# Patient Record
Sex: Female | Born: 1940 | Race: White | Hispanic: No | Marital: Married | Smoking: Former smoker
Health system: Southern US, Community
[De-identification: ages and names within clinical notes are randomized; demographics above are authoritative.]

## PROBLEM LIST (undated history)

## (undated) DIAGNOSIS — E039 Hypothyroidism, unspecified: Secondary | ICD-10-CM

## (undated) DIAGNOSIS — I10 Essential (primary) hypertension: Secondary | ICD-10-CM

## (undated) DIAGNOSIS — N289 Disorder of kidney and ureter, unspecified: Secondary | ICD-10-CM

## (undated) DIAGNOSIS — N739 Female pelvic inflammatory disease, unspecified: Secondary | ICD-10-CM

## (undated) DIAGNOSIS — I219 Acute myocardial infarction, unspecified: Secondary | ICD-10-CM

## (undated) DIAGNOSIS — M1990 Unspecified osteoarthritis, unspecified site: Secondary | ICD-10-CM

## (undated) DIAGNOSIS — E785 Hyperlipidemia, unspecified: Secondary | ICD-10-CM

## (undated) DIAGNOSIS — I2510 Atherosclerotic heart disease of native coronary artery without angina pectoris: Secondary | ICD-10-CM

## (undated) DIAGNOSIS — I5040 Unspecified combined systolic (congestive) and diastolic (congestive) heart failure: Secondary | ICD-10-CM

## (undated) DIAGNOSIS — D649 Anemia, unspecified: Secondary | ICD-10-CM

## (undated) DIAGNOSIS — R911 Solitary pulmonary nodule: Secondary | ICD-10-CM

## (undated) DIAGNOSIS — J449 Chronic obstructive pulmonary disease, unspecified: Secondary | ICD-10-CM

## (undated) DIAGNOSIS — E119 Type 2 diabetes mellitus without complications: Secondary | ICD-10-CM

## (undated) HISTORY — PX: MANDIBLE FRACTURE SURGERY: SHX706

## (undated) HISTORY — DX: Hyperlipidemia, unspecified: E78.5

## (undated) HISTORY — DX: Unspecified combined systolic (congestive) and diastolic (congestive) heart failure: I50.40

## (undated) HISTORY — DX: Hypothyroidism, unspecified: E03.9

## (undated) HISTORY — DX: Female pelvic inflammatory disease, unspecified: N73.9

## (undated) HISTORY — DX: Unspecified osteoarthritis, unspecified site: M19.90

## (undated) HISTORY — DX: Anemia, unspecified: D64.9

## (undated) HISTORY — DX: Acute myocardial infarction, unspecified: I21.9

## (undated) HISTORY — DX: Type 2 diabetes mellitus without complications: E11.9

## (undated) HISTORY — PX: BACK SURGERY: SHX140

## (undated) HISTORY — DX: Chronic obstructive pulmonary disease, unspecified: J44.9

## (undated) HISTORY — PX: TUBAL LIGATION: SHX77

## (undated) HISTORY — DX: Essential (primary) hypertension: I10

## (undated) HISTORY — DX: Disorder of kidney and ureter, unspecified: N28.9

## (undated) HISTORY — DX: Atherosclerotic heart disease of native coronary artery without angina pectoris: I25.10

## (undated) HISTORY — DX: Solitary pulmonary nodule: R91.1

---

## 1997-12-22 ENCOUNTER — Other Ambulatory Visit: Admission: RE | Admit: 1997-12-22 | Discharge: 1997-12-22 | Payer: Self-pay | Admitting: Family Medicine

## 1999-06-05 ENCOUNTER — Other Ambulatory Visit: Admission: RE | Admit: 1999-06-05 | Discharge: 1999-06-05 | Payer: Self-pay | Admitting: Family Medicine

## 2000-07-03 ENCOUNTER — Other Ambulatory Visit: Admission: RE | Admit: 2000-07-03 | Discharge: 2000-07-03 | Payer: Self-pay | Admitting: Family Medicine

## 2001-12-08 ENCOUNTER — Other Ambulatory Visit: Admission: RE | Admit: 2001-12-08 | Discharge: 2001-12-08 | Payer: Self-pay | Admitting: Family Medicine

## 2002-03-10 ENCOUNTER — Encounter: Payer: Self-pay | Admitting: Nephrology

## 2002-03-10 ENCOUNTER — Ambulatory Visit (HOSPITAL_COMMUNITY): Admission: RE | Admit: 2002-03-10 | Discharge: 2002-03-10 | Payer: Self-pay | Admitting: Nephrology

## 2003-01-13 ENCOUNTER — Other Ambulatory Visit: Admission: RE | Admit: 2003-01-13 | Discharge: 2003-01-13 | Payer: Self-pay | Admitting: Family Medicine

## 2004-05-14 ENCOUNTER — Other Ambulatory Visit: Admission: RE | Admit: 2004-05-14 | Discharge: 2004-05-14 | Payer: Self-pay | Admitting: Family Medicine

## 2005-07-15 ENCOUNTER — Encounter: Payer: Self-pay | Admitting: Internal Medicine

## 2005-08-16 ENCOUNTER — Other Ambulatory Visit: Admission: RE | Admit: 2005-08-16 | Discharge: 2005-08-16 | Payer: Self-pay | Admitting: Family Medicine

## 2007-12-11 ENCOUNTER — Encounter: Admission: RE | Admit: 2007-12-11 | Discharge: 2007-12-11 | Payer: Self-pay | Admitting: Family Medicine

## 2007-12-18 ENCOUNTER — Emergency Department (HOSPITAL_COMMUNITY): Admission: EM | Admit: 2007-12-18 | Discharge: 2007-12-19 | Payer: Self-pay | Admitting: Emergency Medicine

## 2007-12-24 ENCOUNTER — Ambulatory Visit: Payer: Self-pay | Admitting: Internal Medicine

## 2007-12-24 DIAGNOSIS — J441 Chronic obstructive pulmonary disease with (acute) exacerbation: Secondary | ICD-10-CM | POA: Insufficient documentation

## 2007-12-30 DIAGNOSIS — R918 Other nonspecific abnormal finding of lung field: Secondary | ICD-10-CM

## 2008-03-14 ENCOUNTER — Encounter: Admission: RE | Admit: 2008-03-14 | Discharge: 2008-03-14 | Payer: Self-pay | Admitting: Family Medicine

## 2008-08-03 ENCOUNTER — Encounter: Payer: Self-pay | Admitting: Internal Medicine

## 2008-08-03 ENCOUNTER — Encounter: Admission: RE | Admit: 2008-08-03 | Discharge: 2008-08-03 | Payer: Self-pay | Admitting: Family Medicine

## 2008-08-03 ENCOUNTER — Ambulatory Visit: Payer: Self-pay

## 2008-10-12 ENCOUNTER — Encounter: Payer: Self-pay | Admitting: Internal Medicine

## 2008-10-14 ENCOUNTER — Ambulatory Visit: Payer: Self-pay | Admitting: Internal Medicine

## 2008-10-14 DIAGNOSIS — E119 Type 2 diabetes mellitus without complications: Secondary | ICD-10-CM

## 2008-10-14 DIAGNOSIS — N259 Disorder resulting from impaired renal tubular function, unspecified: Secondary | ICD-10-CM | POA: Insufficient documentation

## 2008-11-04 ENCOUNTER — Encounter (INDEPENDENT_AMBULATORY_CARE_PROVIDER_SITE_OTHER): Payer: Self-pay | Admitting: Internal Medicine

## 2008-11-04 ENCOUNTER — Inpatient Hospital Stay (HOSPITAL_COMMUNITY): Admission: EM | Admit: 2008-11-04 | Discharge: 2008-11-22 | Payer: Self-pay | Admitting: Emergency Medicine

## 2008-11-04 ENCOUNTER — Ambulatory Visit: Payer: Self-pay | Admitting: Emergency Medicine

## 2008-11-04 ENCOUNTER — Ambulatory Visit: Payer: Self-pay | Admitting: *Deleted

## 2008-11-05 ENCOUNTER — Encounter (INDEPENDENT_AMBULATORY_CARE_PROVIDER_SITE_OTHER): Payer: Self-pay | Admitting: Internal Medicine

## 2008-11-16 ENCOUNTER — Encounter (INDEPENDENT_AMBULATORY_CARE_PROVIDER_SITE_OTHER): Payer: Self-pay | Admitting: Nephrology

## 2008-11-16 ENCOUNTER — Ambulatory Visit: Payer: Self-pay | Admitting: Vascular Surgery

## 2008-12-14 ENCOUNTER — Encounter: Payer: Self-pay | Admitting: Internal Medicine

## 2008-12-15 DIAGNOSIS — R000 Tachycardia, unspecified: Secondary | ICD-10-CM

## 2008-12-15 DIAGNOSIS — I219 Acute myocardial infarction, unspecified: Secondary | ICD-10-CM | POA: Insufficient documentation

## 2008-12-15 DIAGNOSIS — D649 Anemia, unspecified: Secondary | ICD-10-CM | POA: Insufficient documentation

## 2008-12-15 DIAGNOSIS — I5040 Unspecified combined systolic (congestive) and diastolic (congestive) heart failure: Secondary | ICD-10-CM | POA: Insufficient documentation

## 2008-12-15 DIAGNOSIS — E785 Hyperlipidemia, unspecified: Secondary | ICD-10-CM | POA: Insufficient documentation

## 2008-12-15 DIAGNOSIS — I10 Essential (primary) hypertension: Secondary | ICD-10-CM | POA: Insufficient documentation

## 2008-12-15 DIAGNOSIS — E039 Hypothyroidism, unspecified: Secondary | ICD-10-CM | POA: Insufficient documentation

## 2008-12-16 ENCOUNTER — Ambulatory Visit: Payer: Self-pay | Admitting: Internal Medicine

## 2008-12-16 ENCOUNTER — Encounter: Payer: Self-pay | Admitting: Cardiology

## 2008-12-16 DIAGNOSIS — I5031 Acute diastolic (congestive) heart failure: Secondary | ICD-10-CM

## 2008-12-22 ENCOUNTER — Telehealth: Payer: Self-pay | Admitting: Cardiology

## 2009-01-17 ENCOUNTER — Ambulatory Visit: Payer: Self-pay | Admitting: Cardiology

## 2009-01-24 ENCOUNTER — Telehealth (INDEPENDENT_AMBULATORY_CARE_PROVIDER_SITE_OTHER): Payer: Self-pay | Admitting: *Deleted

## 2009-01-25 ENCOUNTER — Ambulatory Visit: Payer: Self-pay

## 2009-01-25 ENCOUNTER — Encounter: Payer: Self-pay | Admitting: Cardiology

## 2009-01-31 ENCOUNTER — Emergency Department (HOSPITAL_COMMUNITY): Admission: EM | Admit: 2009-01-31 | Discharge: 2009-02-01 | Payer: Self-pay | Admitting: Emergency Medicine

## 2009-02-21 ENCOUNTER — Telehealth: Payer: Self-pay | Admitting: Cardiology

## 2009-02-27 ENCOUNTER — Ambulatory Visit: Payer: Self-pay | Admitting: Cardiology

## 2009-02-27 ENCOUNTER — Encounter (INDEPENDENT_AMBULATORY_CARE_PROVIDER_SITE_OTHER): Payer: Self-pay | Admitting: *Deleted

## 2009-02-27 DIAGNOSIS — R0602 Shortness of breath: Secondary | ICD-10-CM | POA: Insufficient documentation

## 2009-02-27 LAB — CONVERTED CEMR LAB
ALT: 23 units/L (ref 0–35)
AST: 31 units/L (ref 0–37)
Albumin: 3.7 g/dL (ref 3.5–5.2)
Alkaline Phosphatase: 65 units/L (ref 39–117)
Total CHOL/HDL Ratio: 3
Total Protein: 7.5 g/dL (ref 6.0–8.3)

## 2009-03-18 ENCOUNTER — Emergency Department (HOSPITAL_COMMUNITY): Admission: EM | Admit: 2009-03-18 | Discharge: 2009-03-19 | Payer: Self-pay | Admitting: Emergency Medicine

## 2009-04-06 ENCOUNTER — Ambulatory Visit: Payer: Self-pay | Admitting: Internal Medicine

## 2009-04-06 ENCOUNTER — Encounter: Admission: RE | Admit: 2009-04-06 | Discharge: 2009-04-06 | Payer: Self-pay | Admitting: Family Medicine

## 2009-04-06 ENCOUNTER — Encounter: Admission: RE | Admit: 2009-04-06 | Discharge: 2009-04-06 | Payer: Self-pay | Admitting: Internal Medicine

## 2009-04-18 ENCOUNTER — Encounter: Admission: RE | Admit: 2009-04-18 | Discharge: 2009-04-18 | Payer: Self-pay | Admitting: Family Medicine

## 2009-04-25 ENCOUNTER — Emergency Department (HOSPITAL_COMMUNITY): Admission: EM | Admit: 2009-04-25 | Discharge: 2009-04-25 | Payer: Self-pay | Admitting: Emergency Medicine

## 2009-04-25 ENCOUNTER — Telehealth: Payer: Self-pay | Admitting: Cardiology

## 2009-06-22 ENCOUNTER — Ambulatory Visit: Payer: Self-pay | Admitting: Cardiovascular Disease

## 2009-06-22 ENCOUNTER — Inpatient Hospital Stay (HOSPITAL_COMMUNITY): Admission: EM | Admit: 2009-06-22 | Discharge: 2009-06-30 | Payer: Self-pay | Admitting: Emergency Medicine

## 2009-06-23 ENCOUNTER — Ambulatory Visit: Payer: Self-pay | Admitting: Vascular Surgery

## 2009-06-23 ENCOUNTER — Encounter (INDEPENDENT_AMBULATORY_CARE_PROVIDER_SITE_OTHER): Payer: Self-pay | Admitting: Internal Medicine

## 2009-07-22 ENCOUNTER — Observation Stay (HOSPITAL_COMMUNITY): Admission: EM | Admit: 2009-07-22 | Discharge: 2009-07-24 | Payer: Self-pay | Admitting: Emergency Medicine

## 2009-07-24 ENCOUNTER — Telehealth (INDEPENDENT_AMBULATORY_CARE_PROVIDER_SITE_OTHER): Payer: Self-pay | Admitting: *Deleted

## 2009-09-25 ENCOUNTER — Encounter (INDEPENDENT_AMBULATORY_CARE_PROVIDER_SITE_OTHER): Payer: Self-pay | Admitting: *Deleted

## 2009-10-16 ENCOUNTER — Ambulatory Visit: Payer: Self-pay | Admitting: Internal Medicine

## 2009-10-23 ENCOUNTER — Encounter: Admission: RE | Admit: 2009-10-23 | Discharge: 2009-10-23 | Payer: Self-pay | Admitting: Family Medicine

## 2009-12-11 ENCOUNTER — Ambulatory Visit: Payer: Self-pay | Admitting: Cardiology

## 2009-12-12 ENCOUNTER — Encounter: Admission: RE | Admit: 2009-12-12 | Discharge: 2010-03-12 | Payer: Self-pay | Admitting: Family Medicine

## 2010-02-13 ENCOUNTER — Encounter: Payer: Self-pay | Admitting: Cardiology

## 2010-03-13 ENCOUNTER — Encounter
Admission: RE | Admit: 2010-03-13 | Discharge: 2010-06-11 | Payer: Self-pay | Source: Home / Self Care | Attending: Family Medicine | Admitting: Family Medicine

## 2010-04-16 ENCOUNTER — Encounter: Admission: RE | Admit: 2010-04-16 | Discharge: 2010-04-16 | Payer: Self-pay | Admitting: Internal Medicine

## 2010-04-16 ENCOUNTER — Ambulatory Visit: Payer: Self-pay | Admitting: Internal Medicine

## 2010-07-08 ENCOUNTER — Encounter: Payer: Self-pay | Admitting: Family Medicine

## 2010-07-17 NOTE — Progress Notes (Signed)
Summary: oxygen  Phone Note Call from Patient Call back at [phone]   Caller: Daughter cindy Call For: young Summary of Call: would like to talk to nurse about pt going home on oxygen Initial call taken by: Rickard Patience,  July 24, 2009 2:47 PM  Follow-up for Phone Call        pt is already on o2 pt is going to see cy in march for f/u told daughter to discuss with cy at that time because they want pt to remain on o2 so hospital has agreed to leave o2 order as is until pt see's cy back in march--per daughter Follow-up by: Philipp Deputy CMA,  July 24, 2009 4:10 PM

## 2010-07-17 NOTE — Assessment & Plan Note (Signed)
Summary: rov//td   Copy to:  Scott Long Primary Provider/Referring Provider:  [facility]  CC:  ROV and No new complaints.  History of Present Illness: 10/14/08- COPD, Lung nosules/ MAIC?  [place] with [name] [facility] 08/03/08- Scattered thickening and nodularity, stable from 6/09. Radiologist suggested [facility]. Comfortable at rest on oxygen but very easily DOE. Chronic tingling paresthesias left hand and arm, little headache. Being RX'd for cellulitis for legs. Rarely coughs any phlegm. Denies fever, blood. Feels some swelling left neck.Stays on oxygen 2.5 L/M [place]  April 06, 2009- Dyspnea, Diastolic CHF, Lung nodule, COPD Hosp in May with MI, CHF, renal insufficiency, respiratory failure and pneumonia Discharged from [facility] psychiatric 1 week ago.  f/u-in hosp.1 wk. ago,sob slightly worse w/exertion, no cough or wheeze. Says hospital was cleaner air than the mold and dust of her home. No smokers. Continues VentolinHFA 2-3 x daily. Neb machine not currently needing. Has home O2 2 l, portable is on 1 L. Denies cough or chest pain now.  Oct 16, 2009- Dyspnea, Diastolic CHF, Lung nodule, COPD..........................Marland Kitchenhere with husband Hosp in February with acute bronchits and co-morbidities.  Here in wheelchair. She says she can walk a little more than 25 feet in home. Continuous O2 at 2.5 L via [employer]. Denies routine cough, chest pain or ankle edema- sits with feet elevated. Rare need for nebulizer machine. CXR had not shown Lung nodule and VQ scan was low probability. Getting injections for anemia.    Current Medications (verified): 1)  Levothyroxine Sodium 75 Mcg Tabs (Levothyroxine Sodium) .Marland Kitchen.. 1 Tab By Mouth Once Daily 2)  Amlodipine Besylate 5 Mg Tabs (Amlodipine Besylate) .Marland Kitchen.. 1 Tab By Mouth Once Daily 3)  Pantoprazole Sodium 40 Mg Tbec (Pantoprazole Sodium) .... On Tab Once Daily  Pt Using Husband's Medication. 4)  Ecotrin 325 Mg  Tbec (Aspirin) .... Take 1 By Mouth  Once Daily 5)  Zetia 10 Mg  Tabs (Ezetimibe) .... Take 1 By Mouth Once Daily 6)  Lovaza 1 Gm  Caps (Omega-3-Acid Ethyl Esters) .... Take 1 Capsule By Mouth Once Daily 7)  Pulmicort 0.5 Mg/44ml  Susp (Budesonide) .Marland Kitchen.. 1 Two Times A Day Neb 8)  Ipratropium-Albuterol 0.5-2.5 (3) Mg/52ml  Soln (Ipratropium-Albuterol) .... Two Times A Day 9)  Ventolin Hfa 108 (90 Base) Mcg/act Aers (Albuterol Sulfate) .... As Needed 10)  Isosorbide Mononitrate Cr 30 Mg Xr24h-Tab (Isosorbide Mononitrate) .... Take One Tablet By Mouth Daily 11)  Furosemide 20 Mg Tabs (Furosemide) .... As Needed 12)  Hydralazine Hcl 25 Mg Tabs (Hydralazine Hcl) .... Take One Tablet By Mouth Three Times A Day 13)  Plavix 75 Mg Tabs (Clopidogrel Bisulfate) .... Take One Tablet By Mouth Daily 14)  Simvastatin 40 Mg Tabs (Simvastatin) .... Take One Tablet By Mouth Daily At Bedtime 15)  Diltiazem Hcl Er Beads 120 Mg Xr24h-Cap (Diltiazem Hcl Er Beads) .... Take One Capsule By Mouth Daily 16)  Januvia 25 Mg Tabs (Sitagliptin Phosphate) .Marland Kitchen.. 1 Tab By Mouth Once Daily 17)  Fiber Laxative 625 Mg Tabs (Calcium Polycarbophil) .... Once Daily 18)  Stool Softener Laxative 8.6-50 Mg Tabs (Sennosides-Docusate Sodium) .... Once Daily 19)  Risperidone 3 Mg Tabs (Risperidone) .... Take 1 Tablet By Mouth Once A Day 20)  Depakote 250 Mg Tbec (Divalproex Sodium) .... Take 1 Tablet 4 Times A Day By Mouth  Allergies (verified): 1)  ! Flagyl 2)  ! Allopurinol 3)  ! * Latex 4)  ! Septra 5)  ! Cipro (Ciprofloxacin) 6)  !  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   History of tubal ligation.  History of jaw surgery.      Family History: Last updated: 12/15/2008 emphysema, CHF- sisters lung ca- father died glioblastoma- mother   Noncontributory for myocardial infarction or strokes.   Social History: Last updated: 04/06/2009  She lives in [place] with her husband.   She quit  smoking 8 years ago.  No alcohol.      Risk Factors: Smoking Status: quit (12/24/2007)  Review of Systems      See HPI       The patient complains of dyspnea on exertion and peripheral edema.  The patient denies anorexia, fever, weight loss, weight gain, vision loss, decreased hearing, hoarseness, chest pain, syncope, prolonged cough, headaches, hemoptysis, abdominal pain, and severe indigestion/heartburn.    Vital Signs:  Patient profile:   70 year old female Height:      62 inches Weight:      175.4 pounds O2 Sat:      99 % on 2.5 L/min Pulse rate:   72 / minute BP sitting:   126 / 84  (left arm) Cuff size:   regular  Vitals Entered By: Reynaldo Minium CMA (Oct 16, 2009 1:39 PM)  O2 Sat at Rest %:  99 O2 Flow:  2.5 L/min  Physical Exam  Additional Exam:  General: A/Ox3; pleasant and cooperative, NAD, obese, calm, supplemental oxygen with demand regulator, sat 99% on 2.5L SKIN: no rash, lesions NODES: no lymphadenopathy HEENT: [place]/AT, EOM- WNL, Conjuctivae- clear, PERRLA, TM-WNL, Nose- clear, Throat- clear and wnl NECK: Supple w/ fair ROM, JVD- none, normal carotid impulses w/o bruits Thyroid-  CHEST: Wheeze in upper zones without rales or increased effort HEART: RRR, no m/g/r heard ABDOMEN:  ZOX:WRUE, nl pulses, 1 + edema NEURO: Grossly intact to observation      Impression & Recommendations:  Problem # 1:  COPD (ICD-496) Stable control after winter viral pattern acute exacerbation, She can take her oxygen  off some in daytime if at rest. She is comfortable and is not feeling need to increase meds just to clear mild residual wheeze. We could add Spiriva, but I don't think she would see cost-effective improvement.  Problem # 2:  LUNG NODULE (ICD-518.89)  No lung nodule identified on most recent CXR.  Medications Added to Medication List This Visit: 1)  Miralax Powd (Polyethylene glycol 3350) .... Once daily 2)  Vitamin D 1000 Unit Tabs (Cholecalciferol) .... Three times a day 3)  One Daily Tabs (Multiple vitamin) .... Qd 4)  Calcarb 600 1500 Mg Tabs (Calcium carbonate) .... Qd 5)  Oxygen 2.5 L Continuous  .... [facility]  Other Orders: Est. Patient Level III [phone])  Patient Instructions: 1)  Please schedule a follow-up appointment in 6 months. 2)  continue present treatment- call sooner if needed

## 2010-07-17 NOTE — Assessment & Plan Note (Signed)
Summary: f89m   Referring Provider:  Lindaann Pascal Primary Provider:  [facility]  CC:  sob and pt did not bring med list called out orally.  History of Present Illness: Pleasant female with a history of COPD, diastolic congestive heart failure and renal insufficiency for f/u. She had a non-ST elevation myocardial infarction previously in the setting of sepsis. An echocardiogram was performed on Nov 05, 2008 and it showed an ejection fraction of 40-45% with wall motion abnormalities. Given renal insufficiency it was felt that medical therapy was indicated. I last saw her on August 3 of this year. We scheduled a Myoview which was performed on January 25, 2009. This showed no ischemia or infarction and her ejection fraction was 73%. Since then she has dyspnea with activities. There is no orthopnea, PND or pedal edema. Her dyspnea is relieved with rest. There is no associated chest pain. She does not have exertional chest pain, palpitations or syncope.  Current Medications (verified): 1)  Levothyroxine Sodium 75 Mcg Tabs (Levothyroxine Sodium) .Marland Kitchen.. 1 Tab By Mouth Once Daily 2)  Amlodipine Besylate 5 Mg Tabs (Amlodipine Besylate) .Marland Kitchen.. 1 Tab By Mouth Once Daily 3)  Pantoprazole Sodium 40 Mg Tbec (Pantoprazole Sodium) .... On Tab Once Daily  Pt Using Husband's Medication. 4)  Ecotrin 325 Mg  Tbec (Aspirin) .... Take 1 By Mouth Once Daily 5)  Zetia 10 Mg  Tabs (Ezetimibe) .... Take 1 By Mouth Once Daily 6)  Lovaza 1 Gm  Caps (Omega-3-Acid Ethyl Esters) .... Take 1 Capsule By Mouth Once Daily 7)  Pulmicort 0.5 Mg/24ml  Susp (Budesonide) .Marland Kitchen.. 1 Two Times A Day Neb 8)  Ipratropium-Albuterol 0.5-2.5 (3) Mg/22ml  Soln (Ipratropium-Albuterol) .... Two Times A Day 9)  Ventolin Hfa 108 (90 Base) Mcg/act Aers (Albuterol Sulfate) .... As Needed 10)  Isosorbide Mononitrate Cr 30 Mg Xr24h-Tab (Isosorbide Mononitrate) .... Take One Tablet By Mouth Daily 11)  Hydralazine Hcl 25 Mg Tabs (Hydralazine Hcl) .... Take One Tablet By  Mouth Three Times A Day 12)  Plavix 75 Mg Tabs (Clopidogrel Bisulfate) .... Take One Tablet By Mouth Daily 13)  Simvastatin 40 Mg Tabs (Simvastatin) .... Take One Tablet By Mouth Daily At Bedtime 14)  Diltiazem Hcl Er Beads 120 Mg Xr24h-Cap (Diltiazem Hcl Er Beads) .... Take One Capsule By Mouth Daily 15)  Miralax  Powd (Polyethylene Glycol 3350) .... Once Daily 16)  Stool Softener Laxative 8.6-50 Mg Tabs (Sennosides-Docusate Sodium) .... Once Daily 17)  Risperidone 3 Mg Tabs (Risperidone) .... 1/2 Tab By Mouth Once Daily 18)  Depakote 250 Mg Tbec (Divalproex Sodium) .... Take 1 Tablet 4 Times A Day By Mouth 19)  Vitamin D 1000 Unit Tabs (Cholecalciferol) .... Three Times A Day 20)  One Daily  Tabs (Multiple Vitamin) .... Qd 21)  Calcarb 600 1500 Mg Tabs (Calcium Carbonate) .... Qd 22)  Oxygen 2.5 L Continuous .... [facility]  Allergies: 1)  ! Flagyl 2)  ! Allopurinol 3)  ! * Latex 4)  ! Septra 5)  ! Cipro (Ciprofloxacin) 6)  ! 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  History of back surgery.   History of tubal ligation.  History of jaw surgery.      Social History: Reviewed  history from 04/06/2009 and no changes required.  She lives in [place] with her husband.  Former tobacco abuse.  No alcohol.      Review of Systems       Problems with weakness and gait instability as well as arthritis but no fevers or chills, productive cough, hemoptysis, dysphasia, odynophagia, melena, hematochezia,  dysuria, hematuria, rash, seizure activity, orthopnea, PND, pedal edema, claudication. Remaining systems are negative.   Vital Signs:  Patient profile:   70 year old female Height:      62 inches Weight:      173 pounds BMI:     31.76 Pulse rate:   80 / minute Resp:     12 per minute BP sitting:   138 / 69  (left arm)  Vitals Entered By: Kem Parkinson (December 11, 2009 2:01 PM)   Physical Exam  General:  Well-developed well-nourished in no acute distress.  Skin is warm and dry.  HEENT is normal.  Neck is supple. No thyromegaly.  Chest is clear to auscultation with normal expansion.  Cardiovascular exam is regular rate and rhythm.  Abdominal exam nontender or distended. No masses palpated. Extremities show no edema. neuro grossly intact    EKG  Procedure date:  12/11/2009  Findings:      Normal sinus rhythm at a rate of 78. Left anterior fascicular block. RV conduction delay.  Impression & Recommendations:  Problem # 1:  DYSPNEA (ICD-786.05) Most likely secondary to lung disease. Her updated medication list for this problem includes:    Amlodipine Besylate 5 Mg Tabs (Amlodipine besylate) .Marland Kitchen... 1 tab by mouth once daily    Ecotrin 325 Mg Tbec (Aspirin) .Marland Kitchen... Take 1 by mouth once daily    Diltiazem Hcl Er Beads 120 Mg Xr24h-cap (Diltiazem hcl er beads) .Marland Kitchen... Take one capsule by mouth daily  Problem # 2:  HYPERTENSION (ICD-401.9) Continue present medications. Her updated medication list for this problem includes:    Amlodipine Besylate 5 Mg Tabs (Amlodipine besylate) .Marland Kitchen... 1 tab by mouth once daily    Ecotrin 325 Mg Tbec (Aspirin) .Marland Kitchen... Take 1 by mouth once daily    Hydralazine Hcl 25 Mg Tabs (Hydralazine hcl) .Marland Kitchen... Take one tablet by mouth three times a day    Diltiazem Hcl Er Beads 120 Mg Xr24h-cap (Diltiazem hcl er beads) .Marland Kitchen... Take one capsule by mouth daily  Problem # 3:  MYOCARDIAL INFARCTION (ICD-410.90)  The following medications were removed from the  medication list:    Plavix 75 Mg Tabs (Clopidogrel bisulfate) .Marland Kitchen... Take one tablet by mouth daily Her updated medication list for this problem includes:    Amlodipine Besylate 5 Mg Tabs (Amlodipine besylate) .Marland Kitchen... 1 tab by mouth once daily    Ecotrin 325 Mg Tbec (Aspirin) .Marland Kitchen... Take 1 by mouth once daily    Isosorbide Mononitrate Cr 30 Mg Xr24h-tab (Isosorbide mononitrate) .Marland Kitchen... Take one tablet by mouth daily    Diltiazem Hcl Er Beads 120 Mg Xr24h-cap (Diltiazem hcl er beads) .Marland Kitchen... Take one capsule by mouth daily  Problem # 4:  HYPERLIPIDEMIA (ICD-272.4) Discontinue Zocor given recent FDA warning. Begin Pravachol 40 mg p.o. daily. Check lipids and liver in 6 weeks. Her updated medication list for this problem includes:    Zetia 10 Mg Tabs (Ezetimibe) .Marland Kitchen... Take 1 by mouth once daily    Lovaza 1 Gm Caps (Omega-3-acid ethyl esters) .Marland Kitchen... Take 1 capsule by mouth once daily    Pravastatin Sodium 40 Mg Tabs (Pravastatin sodium) .Marland Kitchen... Take one  tablet by mouth daily at bedtime  Problem # 5:  HYPOTHYROIDISM (ICD-244.9)  Her updated medication list for this problem includes:    Levothyroxine Sodium 75 Mcg Tabs (Levothyroxine sodium) .Marland Kitchen... 1 tab by mouth once daily  Her updated medication list for this problem includes:    Levothyroxine Sodium 75 Mcg Tabs (Levothyroxine sodium) .Marland Kitchen... 1 tab by mouth once daily  Problem # 6:  COPD (ICD-496)  Management per pulmonary. Her updated medication list for this problem includes:    Pulmicort 0.5 Mg/69ml Susp (Budesonide) .Marland Kitchen... 1 two times a day neb    Ipratropium-albuterol 0.5-2.5 (3) Mg/16ml Soln (Ipratropium-albuterol) .Marland Kitchen..Marland Kitchen Two times a day    Ventolin Hfa 108 (90 Base) Mcg/act Aers (Albuterol sulfate) .Marland Kitchen... As needed  Her updated medication list for this problem includes:    Pulmicort 0.5 Mg/33ml Susp (Budesonide) .Marland Kitchen... 1 two times a day neb    Ipratropium-albuterol 0.5-2.5 (3) Mg/64ml Soln (Ipratropium-albuterol) .Marland Kitchen..Marland Kitchen Two times a day    Ventolin Hfa  108 (90 Base) Mcg/act Aers (Albuterol sulfate) .Marland Kitchen... As needed  Problem # 7:  RENAL INSUFFICIENCY (ICD-588.9) Management per nephrology.  Problem # 8:  AODM (ICD-250.00)  Her updated medication list for this problem includes:    Ecotrin 325 Mg Tbec (Aspirin) .Marland Kitchen... Take 1 by mouth once daily  Her updated medication list for this problem includes:    Ecotrin 325 Mg Tbec (Aspirin) .Marland Kitchen... Take 1 by mouth once daily  Patient Instructions: 1)  Your physician recommends that you schedule a follow-up appointment in:ONE YEAR 2)  Your physician recommends that you return for lab work in:6 WEEKS-2ND WEEK IN [place] 3)  Your physician has recommended you make the following change in your medication: STOP PLAVIX 4)  START PRAVASTATIN 50MG  ONE TABLET ONCE DAILY Prescriptions: PRAVASTATIN SODIUM 40 MG TABS (PRAVASTATIN SODIUM) Take one tablet by mouth daily at bedtime  #30 x 12   Entered by:   Deliah Goody, RN   Authorized by:   Ferman Hamming, MD, [facility]   Signed by:   Deliah Goody, RN on 12/11/2009   Method used:   Electronically to        [facility] [phone]* (retail)       [address] [place], [place]  [address]       Ph: [phone] or [phone]       Fax: [phone]   RxID:   [id number]

## 2010-07-17 NOTE — Letter (Signed)
Summary: Appointment - Missed  [facility]     [place], [place]    Phone:   Fax:      September 25, 2009 MRN: [id number]   [facility] [address] [place], [place]  [address]   Dear Ms. Plato,  Our records indicate you missed your appointment on  09-12-2009   with  Dr. Jens Som  It is very important that we reach you to reschedule this appointment. We look forward to participating in your health care needs. Please contact us at the number listed above at your earliest convenience to reschedule this appointment.     Sincerely,      Lorne Skeens  [facility] Scheduling Team

## 2010-07-17 NOTE — Assessment & Plan Note (Signed)
Summary: rov 6 months///kp   Copy to:  Scott Long Primary Murel Shenberger/Referring [place] Mcphee:  [facility]  CC:  6 month follow up visit-COPD; Increased SOB with activity..  History of Present Illness:  April 06, 2009- Dyspnea, Diastolic CHF, Lung nodule, COPD Hosp in May with MI, CHF, renal insufficiency, respiratory failure and pneumonia Discharged from [facility] psychiatric 1 week ago.  f/u-in hosp.1 wk. ago,sob slightly worse w/exertion, no cough or wheeze. Says hospital was cleaner air than the mold and dust of her home. No smokers. Continues VentolinHFA 2-3 x daily. Neb machine not currently needing. Has home O2 2 l, portable is on 1 L. Denies cough or chest pain now.  Oct 16, 2009- Dyspnea, Diastolic CHF, Lung nodule, COPD..........................Marland Kitchenhere with husband Hosp in February with acute bronchits and co-morbidities.  Here in wheelchair. She says she can walk a little more than 25 feet in home. Continuous O2 at 2.5 L via [employer]. Denies routine cough, chest pain or ankle edema- sits with feet elevated. Rare need for nebulizer machine. CXR had not shown Lung nodule and VQ scan was low probability. Getting injections for anemia.  April 16, 2010- Dyspnea, Diastolic CHF, Lung nodule, COPD..........................Marland Kitchenhere with husband Nurse-CC: 6 month follow up visit-COPD; Increased SOB with activity. Was seen  by cardiology in August and most of her dyspnea was attributed to her lungs. EF by ECHO was 40-45%. No ischemia. She deinies cough, phlegm or chest pain. Denies any significant change since Spring. She has been able to turn home O2 down from 2.5L to 1.5 L/M.  She is not acitve except walking some room to room at home. Sore feet also limit her activity.  She is getting Procrit injections from Dr Hyman Hopes for anemia.    Preventive Screening-Counseling & Management  Alcohol-Tobacco     Smoking Status: quit     Packs/Day: 0.75     Year Quit: 1999  Current  Medications (verified): 1)  Levothroid 88 Mcg Tabs (Levothyroxine Sodium) .... Take 1 By Mouth Once Daily 2)  Pantoprazole Sodium 40 Mg Tbec (Pantoprazole Sodium) .... On Tab Once Daily  Pt Using Husband's Medication. 3)  Ecotrin 325 Mg  Tbec (Aspirin) .... Take 1 By Mouth Once Daily 4)  Zetia 10 Mg  Tabs (Ezetimibe) .... Take 1 By Mouth Once Daily 5)  Lovaza 1 Gm  Caps (Omega-3-Acid Ethyl Esters) .... Take 1 Capsule By Mouth Once Daily 6)  Ventolin Hfa 108 (90 Base) Mcg/act Aers (Albuterol Sulfate) .... As Needed 7)  Isosorbide Mononitrate Cr 30 Mg Xr24h-Tab (Isosorbide Mononitrate) .... Take One Tablet By Mouth Daily 8)  Hydralazine Hcl 25 Mg Tabs (Hydralazine Hcl) .... Take One Tablet By Mouth Three Times A Day 9)  Miralax  Powd (Polyethylene Glycol 3350) .... Once Daily 10)  Stool Softener Laxative 8.6-50 Mg Tabs (Sennosides-Docusate Sodium) .... Once Daily 11)  Depakote 250 Mg Tbec (Divalproex Sodium) .... Take 1 Tablet 4 Times A Day By Mouth 12)  Oxygen 2.5 L Continuous .... [facility]  Allergies (verified): 1)  ! Flagyl 2)  ! Allopurinol 3)  ! * Latex 4)  ! Septra 5)  ! Cipro (Ciprofloxacin) 6)  ! 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  History of tubal ligation.  History of jaw surgery.      Family History: Last updated: 12/15/2008 emphysema, CHF- sisters lung ca- father died glioblastoma- mother   Noncontributory for myocardial infarction or strokes.   Social  History: Last updated: 12/11/2009  She lives in [place] with her husband.  Former tobacco abuse.  No alcohol.      Risk Factors: Smoking Status: quit (04/16/2010) Packs/Day: 0.75 (04/16/2010)  Social History: Packs/Day:  0.75  Review of Systems      See HPI       The patient complains of shortness of breath with activity and irregular heartbeats.  The patient denies shortness of breath at rest, productive cough, non-productive cough, coughing up blood, chest pain, acid heartburn, indigestion, loss of appetite, weight change, abdominal pain, difficulty swallowing, sore throat, tooth/dental problems, headaches, nasal congestion/difficulty breathing through nose, sneezing, ear ache, hand/feet swelling, rash, and fever.    Vital Signs:  Patient profile:   70 year old female Height:      62 inches O2 Sat:      98 % on 1.5 L/min Pulse rate:   81 / minute BP sitting:   140 / 68  (left arm) Cuff size:   regular  Vitals Entered By: Reynaldo Minium CMA (April 16, 2010 1:55 PM)  O2 Flow:  1.5 L/min CC: 6 month follow up visit-COPD; Increased SOB with activity.   Physical Exam  Additional Exam:  General: A/Ox3; pleasant and cooperative, NAD, obese, calm, supplemental oxygen with demand regulator, sat 98% on 1.5L SKIN: no rash, lesions NODES: no lymphadenopathy HEENT: [place]/AT, EOM- WNL, Conjuctivae- clear, PERRLA, TM-WNL, Nose- clear, Throat- clear and wnl, edentulous NECK: Supple w/ fair ROM, JVD- none, normal carotid impulses w/o bruits Thyroid-  CHEST: Wheeze in upper zones without rales or increased effort HEART: RRR, no m/g/r heard ABDOMEN: obese VOZ:DGUY, nl pulses, trace edema NEURO: Grossly intact to observation, wheelchair      Impression & Recommendations:  Problem # 1:  COPD (ICD-496) Clinically stable. Limiting dyspnea reflects her lung disease but likly also some heart disease, anemia and deconditioning.   Problem # 2:  LUNG NODULE (ICD-518.89)  6 mm nodule LUL  seen on prior CT. We will recheck that.   Medications Added to Medication List This Visit: 1)  Levothroid 88 Mcg Tabs (Levothyroxine sodium) .... Take 1 by mouth once daily  Other Orders: Est. Patient Level IV (40347) Radiology Referral (Radiology)  Patient Instructions: 1)  Please schedule a follow-up appointment in 6 months. 2)  A Chest CT WITHOUT Contrast has been recommended. Your imaging study may require preauthorization.

## 2010-07-17 NOTE — Letter (Signed)
Summary: Patient's Med List  Patient's Med List   Imported By: Marylou Mccoy 12/13/2009 08:30:07  _____________________________________________________________________  External Attachment:    Type:   Image     Comment:   External Document

## 2010-09-02 LAB — URINE CULTURE
Colony Count: 10000
Special Requests: POSITIVE

## 2010-09-02 LAB — BASIC METABOLIC PANEL
BUN: 30 mg/dL — ABNORMAL HIGH (ref 6–23)
BUN: 30 mg/dL — ABNORMAL HIGH (ref 6–23)
BUN: 38 mg/dL — ABNORMAL HIGH (ref 6–23)
BUN: 47 mg/dL — ABNORMAL HIGH (ref 6–23)
BUN: 58 mg/dL — ABNORMAL HIGH (ref 6–23)
CO2: 30 mEq/L (ref 19–32)
Calcium: 10 mg/dL (ref 8.4–10.5)
Calcium: 10.5 mg/dL (ref 8.4–10.5)
Calcium: 10.6 mg/dL — ABNORMAL HIGH (ref 8.4–10.5)
Chloride: 102 mEq/L (ref 96–112)
Chloride: 98 mEq/L (ref 96–112)
Chloride: 99 mEq/L (ref 96–112)
Creatinine, Ser: 1.67 mg/dL — ABNORMAL HIGH (ref 0.4–1.2)
Creatinine, Ser: 2.25 mg/dL — ABNORMAL HIGH (ref 0.4–1.2)
GFR calc Af Amer: 26 mL/min — ABNORMAL LOW (ref >60)
GFR calc non Af Amer: 21 mL/min — ABNORMAL LOW (ref >60)
GFR calc non Af Amer: 22 mL/min — ABNORMAL LOW (ref >60)
GFR calc non Af Amer: 30 mL/min — ABNORMAL LOW (ref >60)
Glucose, Bld: 118 mg/dL — ABNORMAL HIGH (ref 70–99)
Glucose, Bld: 138 mg/dL — ABNORMAL HIGH (ref 70–99)
Glucose, Bld: 84 mg/dL (ref 70–99)
Glucose, Bld: 86 mg/dL (ref 70–99)
Potassium: 4.4 mEq/L (ref 3.5–5.1)
Potassium: 4.9 mEq/L (ref 3.5–5.1)
Potassium: 5.4 mEq/L — ABNORMAL HIGH (ref 3.5–5.1)
Sodium: 139 mEq/L (ref 135–145)
Sodium: 139 mEq/L (ref 135–145)
Sodium: 139 mEq/L (ref 135–145)

## 2010-09-02 LAB — GLUCOSE, CAPILLARY
Glucose-Capillary: 108 mg/dL — ABNORMAL HIGH (ref 70–99)
Glucose-Capillary: 111 mg/dL — ABNORMAL HIGH (ref 70–99)
Glucose-Capillary: 112 mg/dL — ABNORMAL HIGH (ref 70–99)
Glucose-Capillary: 112 mg/dL — ABNORMAL HIGH (ref 70–99)
Glucose-Capillary: 114 mg/dL — ABNORMAL HIGH (ref 70–99)
Glucose-Capillary: 118 mg/dL — ABNORMAL HIGH (ref 70–99)
Glucose-Capillary: 119 mg/dL — ABNORMAL HIGH (ref 70–99)
Glucose-Capillary: 120 mg/dL — ABNORMAL HIGH (ref 70–99)
Glucose-Capillary: 122 mg/dL — ABNORMAL HIGH (ref 70–99)
Glucose-Capillary: 123 mg/dL — ABNORMAL HIGH (ref 70–99)
Glucose-Capillary: 124 mg/dL — ABNORMAL HIGH (ref 70–99)
Glucose-Capillary: 128 mg/dL — ABNORMAL HIGH (ref 70–99)
Glucose-Capillary: 129 mg/dL — ABNORMAL HIGH (ref 70–99)
Glucose-Capillary: 129 mg/dL — ABNORMAL HIGH (ref 70–99)
Glucose-Capillary: 138 mg/dL — ABNORMAL HIGH (ref 70–99)
Glucose-Capillary: 145 mg/dL — ABNORMAL HIGH (ref 70–99)
Glucose-Capillary: 149 mg/dL — ABNORMAL HIGH (ref 70–99)
Glucose-Capillary: 151 mg/dL — ABNORMAL HIGH (ref 70–99)
Glucose-Capillary: 161 mg/dL — ABNORMAL HIGH (ref 70–99)
Glucose-Capillary: 179 mg/dL — ABNORMAL HIGH (ref 70–99)
Glucose-Capillary: 87 mg/dL (ref 70–99)
Glucose-Capillary: 89 mg/dL (ref 70–99)
Glucose-Capillary: 90 mg/dL (ref 70–99)
Glucose-Capillary: 90 mg/dL (ref 70–99)
Glucose-Capillary: 91 mg/dL (ref 70–99)
Glucose-Capillary: 94 mg/dL (ref 70–99)
Glucose-Capillary: 99 mg/dL (ref 70–99)

## 2010-09-02 LAB — CBC
HCT: 32.9 % — ABNORMAL LOW (ref 36.0–46.0)
HCT: 34.7 % — ABNORMAL LOW (ref 36.0–46.0)
Hemoglobin: 11 g/dL — ABNORMAL LOW (ref 12.0–15.0)
Hemoglobin: 9.4 g/dL — ABNORMAL LOW (ref 12.0–15.0)
MCV: 92.3 fL (ref 78.0–100.0)
Platelets: 150 10*3/uL (ref 150–400)
Platelets: 154 10*3/uL (ref 150–400)
Platelets: 156 10*3/uL (ref 150–400)
Platelets: 159 10*3/uL (ref 150–400)
RBC: 3.53 MIL/uL — ABNORMAL LOW (ref 3.87–5.11)
RBC: 3.56 MIL/uL — ABNORMAL LOW (ref 3.87–5.11)
RDW: 14.8 % (ref 11.5–15.5)
RDW: 14.9 % (ref 11.5–15.5)
RDW: 14.9 % (ref 11.5–15.5)
WBC: 7.2 10*3/uL (ref 4.0–10.5)
WBC: 8.4 10*3/uL (ref 4.0–10.5)

## 2010-09-02 LAB — URINE MICROSCOPIC-ADD ON

## 2010-09-02 LAB — COMPREHENSIVE METABOLIC PANEL
AST: 17 U/L (ref 0–37)
Albumin: 2.9 g/dL — ABNORMAL LOW (ref 3.5–5.2)
Albumin: 3.4 g/dL — ABNORMAL LOW (ref 3.5–5.2)
Alkaline Phosphatase: 53 U/L (ref 39–117)
BUN: 36 mg/dL — ABNORMAL HIGH (ref 6–23)
CO2: 29 mEq/L (ref 19–32)
Calcium: 9.3 mg/dL (ref 8.4–10.5)
Creatinine, Ser: 1.68 mg/dL — ABNORMAL HIGH (ref 0.4–1.2)
Creatinine, Ser: 1.91 mg/dL — ABNORMAL HIGH (ref 0.4–1.2)
GFR calc Af Amer: 31 mL/min — ABNORMAL LOW (ref >60)
GFR calc Af Amer: 36 mL/min — ABNORMAL LOW (ref >60)
GFR calc non Af Amer: 30 mL/min — ABNORMAL LOW (ref >60)
Potassium: 4.5 mEq/L (ref 3.5–5.1)
Total Protein: 5.7 g/dL — ABNORMAL LOW (ref 6.0–8.3)
Total Protein: 6.7 g/dL (ref 6.0–8.3)

## 2010-09-02 LAB — URINALYSIS, ROUTINE W REFLEX MICROSCOPIC
Glucose, UA: NEGATIVE mg/dL
Ketones, ur: NEGATIVE mg/dL
Leukocytes, UA: NEGATIVE
Nitrite: NEGATIVE
Specific Gravity, Urine: 1.017 (ref 1.005–1.030)
Urobilinogen, UA: 0.2 mg/dL (ref 0.0–1.0)
pH: 6.5 (ref 5.0–8.0)

## 2010-09-02 LAB — POCT I-STAT, CHEM 8
Chloride: 104 mEq/L (ref 96–112)
Creatinine, Ser: 1.9 mg/dL — ABNORMAL HIGH (ref 0.4–1.2)
Glucose, Bld: 133 mg/dL — ABNORMAL HIGH (ref 70–99)
Potassium: 4.5 mEq/L (ref 3.5–5.1)

## 2010-09-02 LAB — URIC ACID: Uric Acid, Serum: 5.3 mg/dL (ref 2.4–7.0)

## 2010-09-02 LAB — DIFFERENTIAL
Eosinophils Relative: 5 % (ref 0–5)
Lymphocytes Relative: 14 % (ref 12–46)
Lymphocytes Relative: 40 % (ref 12–46)
Lymphs Abs: 2.9 10*3/uL (ref 0.7–4.0)
Monocytes Absolute: 0.8 10*3/uL (ref 0.1–1.0)
Monocytes Relative: 10 % (ref 3–12)
Monocytes Relative: 8 % (ref 3–12)
Neutro Abs: 7.6 10*3/uL (ref 1.7–7.7)

## 2010-09-02 LAB — CK TOTAL AND CKMB (NOT AT ARMC)
CK, MB: 0.9 ng/mL (ref 0.3–4.0)
CK, MB: 1 ng/mL (ref 0.3–4.0)
Relative Index: INVALID (ref 0.0–2.5)
Relative Index: INVALID (ref 0.0–2.5)
Relative Index: INVALID (ref 0.0–2.5)
Total CK: 34 U/L (ref 7–177)
Total CK: 46 U/L (ref 7–177)

## 2010-09-02 LAB — HEMOGLOBIN A1C
Hgb A1c MFr Bld: 5.6 % (ref 4.6–6.1)
Mean Plasma Glucose: 114 mg/dL

## 2010-09-02 LAB — PROTIME-INR: Prothrombin Time: 13 seconds (ref 11.6–15.2)

## 2010-09-02 LAB — PHOSPHORUS: Phosphorus: 4.3 mg/dL (ref 2.3–4.6)

## 2010-09-02 LAB — LIPID PANEL
Cholesterol: 133 mg/dL (ref 0–200)
HDL: 39 mg/dL — ABNORMAL LOW (ref >39)
LDL Cholesterol: 48 mg/dL (ref 0–99)
Total CHOL/HDL Ratio: 3.4 RATIO
Triglycerides: 232 mg/dL — ABNORMAL HIGH (ref <150)
VLDL: 46 mg/dL — ABNORMAL HIGH (ref 0–40)

## 2010-09-02 LAB — POCT CARDIAC MARKERS
CKMB, poc: <1 ng/mL — ABNORMAL LOW (ref 1.0–8.0)
Troponin i, poc: <0.05 ng/mL (ref 0.00–0.09)

## 2010-09-02 LAB — TSH: TSH: 0.952 u[IU]/mL (ref 0.350–4.500)

## 2010-09-02 LAB — TROPONIN I
Troponin I: 0.01 ng/mL (ref 0.00–0.06)
Troponin I: 0.02 ng/mL (ref 0.00–0.06)

## 2010-09-05 LAB — BASIC METABOLIC PANEL
BUN: 30 mg/dL — ABNORMAL HIGH (ref 6–23)
CO2: 28 mEq/L (ref 19–32)
Calcium: 8.8 mg/dL (ref 8.4–10.5)
Chloride: 100 mEq/L (ref 96–112)
Creatinine, Ser: 1.97 mg/dL — ABNORMAL HIGH (ref 0.4–1.2)
GFR calc Af Amer: 30 mL/min — ABNORMAL LOW (ref >60)
GFR calc non Af Amer: 25 mL/min — ABNORMAL LOW (ref >60)
Glucose, Bld: 108 mg/dL — ABNORMAL HIGH (ref 70–99)
Potassium: 4.1 mEq/L (ref 3.5–5.1)
Sodium: 137 mEq/L (ref 135–145)

## 2010-09-05 LAB — DIFFERENTIAL
Basophils Absolute: 0 10*3/uL (ref 0.0–0.1)
Basophils Relative: 0 % (ref 0–1)
Eosinophils Relative: 0 % (ref 0–5)
Lymphocytes Relative: 9 % — ABNORMAL LOW (ref 12–46)
Monocytes Absolute: 0.9 10*3/uL (ref 0.1–1.0)

## 2010-09-05 LAB — BLOOD GAS, ARTERIAL
Drawn by: 295031
O2 Content: 2 L/min
O2 Saturation: 96.7 %
Patient temperature: 98.6
pO2, Arterial: 78.2 mmHg — ABNORMAL LOW (ref 80.0–100.0)

## 2010-09-05 LAB — POCT CARDIAC MARKERS
CKMB, poc: <1 ng/mL — ABNORMAL LOW (ref 1.0–8.0)
Myoglobin, poc: 145 ng/mL (ref 12–200)
Troponin i, poc: <0.05 ng/mL (ref 0.00–0.09)

## 2010-09-05 LAB — CBC
HCT: 31.3 % — ABNORMAL LOW (ref 36.0–46.0)
Hemoglobin: 10.6 g/dL — ABNORMAL LOW (ref 12.0–15.0)
MCHC: 34 g/dL (ref 30.0–36.0)
Platelets: 164 10*3/uL (ref 150–400)
RBC: 3.15 MIL/uL — ABNORMAL LOW (ref 3.87–5.11)
RDW: 15.1 % (ref 11.5–15.5)
WBC: 8.6 10*3/uL (ref 4.0–10.5)

## 2010-09-05 LAB — URINALYSIS, ROUTINE W REFLEX MICROSCOPIC
Glucose, UA: NEGATIVE mg/dL
Hgb urine dipstick: NEGATIVE
Protein, ur: >300 mg/dL — AB
pH: 5.5 (ref 5.0–8.0)

## 2010-09-05 LAB — URINE MICROSCOPIC-ADD ON

## 2010-09-19 LAB — COMPREHENSIVE METABOLIC PANEL
ALT: 14 U/L (ref 0–35)
AST: 21 U/L (ref 0–37)
Albumin: 2.8 g/dL — ABNORMAL LOW (ref 3.5–5.2)
CO2: 29 mEq/L (ref 19–32)
Calcium: 9.3 mg/dL (ref 8.4–10.5)
Chloride: 104 mEq/L (ref 96–112)
Creatinine, Ser: 2.07 mg/dL — ABNORMAL HIGH (ref 0.4–1.2)
GFR calc Af Amer: 29 mL/min — ABNORMAL LOW (ref >60)
GFR calc non Af Amer: 24 mL/min — ABNORMAL LOW (ref >60)
Sodium: 140 mEq/L (ref 135–145)

## 2010-09-19 LAB — CBC
HCT: 32.2 % — ABNORMAL LOW (ref 36.0–46.0)
MCV: 88.6 fL (ref 78.0–100.0)
RBC: 3.63 MIL/uL — ABNORMAL LOW (ref 3.87–5.11)
WBC: 10.9 10*3/uL — ABNORMAL HIGH (ref 4.0–10.5)

## 2010-09-19 LAB — CK TOTAL AND CKMB (NOT AT ARMC)
CK, MB: 1.4 ng/mL (ref 0.3–4.0)
Relative Index: INVALID (ref 0.0–2.5)

## 2010-09-19 LAB — DIFFERENTIAL
Eosinophils Absolute: 0.6 10*3/uL (ref 0.0–0.7)
Eosinophils Relative: 5 % (ref 0–5)
Lymphocytes Relative: 12 % (ref 12–46)
Lymphs Abs: 1.3 10*3/uL (ref 0.7–4.0)
Monocytes Absolute: 1.5 10*3/uL — ABNORMAL HIGH (ref 0.1–1.0)

## 2010-09-19 LAB — URINALYSIS, ROUTINE W REFLEX MICROSCOPIC
Bilirubin Urine: NEGATIVE
Glucose, UA: NEGATIVE mg/dL
Hgb urine dipstick: NEGATIVE
Specific Gravity, Urine: 1.016 (ref 1.005–1.030)
pH: 6 (ref 5.0–8.0)

## 2010-09-19 LAB — URINE MICROSCOPIC-ADD ON

## 2010-09-19 LAB — TROPONIN I: Troponin I: 0.01 ng/mL (ref 0.00–0.06)

## 2010-09-19 LAB — URINE CULTURE: Colony Count: 2000

## 2010-09-20 LAB — DIFFERENTIAL
Basophils Absolute: 0.1 10*3/uL (ref 0.0–0.1)
Basophils Relative: 1 % (ref 0–1)
Eosinophils Relative: 3 % (ref 0–5)
Lymphocytes Relative: 25 % (ref 12–46)

## 2010-09-20 LAB — POCT I-STAT, CHEM 8
BUN: 32 mg/dL — ABNORMAL HIGH (ref 6–23)
HCT: 36 % (ref 36.0–46.0)
Hemoglobin: 12.2 g/dL (ref 12.0–15.0)
Sodium: 142 mEq/L (ref 135–145)
TCO2: 25 mmol/L (ref 0–100)

## 2010-09-20 LAB — CBC
HCT: 36 % (ref 36.0–46.0)
MCHC: 32.9 g/dL (ref 30.0–36.0)
Platelets: 250 10*3/uL (ref 150–400)
RDW: 15.5 % (ref 11.5–15.5)

## 2010-09-20 LAB — URINALYSIS, ROUTINE W REFLEX MICROSCOPIC
Glucose, UA: NEGATIVE mg/dL
Hgb urine dipstick: NEGATIVE
Specific Gravity, Urine: 1.02 (ref 1.005–1.030)
Urobilinogen, UA: 0.2 mg/dL (ref 0.0–1.0)

## 2010-09-20 LAB — URINE MICROSCOPIC-ADD ON

## 2010-09-20 LAB — RAPID URINE DRUG SCREEN, HOSP PERFORMED
Barbiturates: NOT DETECTED
Opiates: NOT DETECTED

## 2010-09-20 LAB — ETHANOL: Alcohol, Ethyl (B): <5 mg/dL (ref 0–10)

## 2010-09-22 LAB — BASIC METABOLIC PANEL
BUN: 34 mg/dL — ABNORMAL HIGH (ref 6–23)
CO2: 29 mEq/L (ref 19–32)
Chloride: 98 mEq/L (ref 96–112)
Creatinine, Ser: 2.54 mg/dL — ABNORMAL HIGH (ref 0.4–1.2)
Potassium: 3.6 mEq/L (ref 3.5–5.1)

## 2010-09-22 LAB — BLOOD GAS, ARTERIAL
Bicarbonate: 29 mEq/L — ABNORMAL HIGH (ref 20.0–24.0)
O2 Content: 1.5 L/min
O2 Saturation: 96.1 %
Patient temperature: 98.6
TCO2: 26.1 mmol/L (ref 0–100)
pH, Arterial: 7.436 — ABNORMAL HIGH (ref 7.350–7.400)

## 2010-09-22 LAB — URINALYSIS, ROUTINE W REFLEX MICROSCOPIC
Ketones, ur: NEGATIVE mg/dL
Nitrite: NEGATIVE
Protein, ur: >300 mg/dL — AB
Urobilinogen, UA: 0.2 mg/dL (ref 0.0–1.0)

## 2010-09-22 LAB — DIFFERENTIAL
Basophils Relative: 1 % (ref 0–1)
Eosinophils Absolute: 0.4 10*3/uL (ref 0.0–0.7)
Eosinophils Relative: 4 % (ref 0–5)
Lymphs Abs: 3.4 10*3/uL (ref 0.7–4.0)
Monocytes Relative: 10 % (ref 3–12)
Neutrophils Relative %: 55 % (ref 43–77)

## 2010-09-22 LAB — CBC
HCT: 39.5 % (ref 36.0–46.0)
MCHC: 32.2 g/dL (ref 30.0–36.0)
MCV: 85.8 fL (ref 78.0–100.0)
Platelets: 328 10*3/uL (ref 150–400)

## 2010-09-22 LAB — URINE MICROSCOPIC-ADD ON

## 2010-09-24 LAB — CBC
HCT: 30.1 % — ABNORMAL LOW (ref 36.0–46.0)
Hemoglobin: 11.2 g/dL — ABNORMAL LOW (ref 12.0–15.0)
MCHC: 32.1 g/dL (ref 30.0–36.0)
MCHC: 33.2 g/dL (ref 30.0–36.0)
MCV: 87.9 fL (ref 78.0–100.0)
MCV: 88.6 fL (ref 78.0–100.0)
MCV: 88.9 fL (ref 78.0–100.0)
Platelets: 286 10*3/uL (ref 150–400)
Platelets: 300 10*3/uL (ref 150–400)
RBC: 3.61 MIL/uL — ABNORMAL LOW (ref 3.87–5.11)
RBC: 3.91 MIL/uL (ref 3.87–5.11)
RDW: 15.9 % — ABNORMAL HIGH (ref 11.5–15.5)
WBC: 12 10*3/uL — ABNORMAL HIGH (ref 4.0–10.5)
WBC: 7.8 10*3/uL (ref 4.0–10.5)

## 2010-09-24 LAB — GLUCOSE, CAPILLARY
Glucose-Capillary: 105 mg/dL — ABNORMAL HIGH (ref 70–99)
Glucose-Capillary: 119 mg/dL — ABNORMAL HIGH (ref 70–99)
Glucose-Capillary: 123 mg/dL — ABNORMAL HIGH (ref 70–99)
Glucose-Capillary: 127 mg/dL — ABNORMAL HIGH (ref 70–99)
Glucose-Capillary: 128 mg/dL — ABNORMAL HIGH (ref 70–99)
Glucose-Capillary: 133 mg/dL — ABNORMAL HIGH (ref 70–99)
Glucose-Capillary: 137 mg/dL — ABNORMAL HIGH (ref 70–99)
Glucose-Capillary: 141 mg/dL — ABNORMAL HIGH (ref 70–99)
Glucose-Capillary: 142 mg/dL — ABNORMAL HIGH (ref 70–99)
Glucose-Capillary: 144 mg/dL — ABNORMAL HIGH (ref 70–99)
Glucose-Capillary: 147 mg/dL — ABNORMAL HIGH (ref 70–99)
Glucose-Capillary: 167 mg/dL — ABNORMAL HIGH (ref 70–99)
Glucose-Capillary: 178 mg/dL — ABNORMAL HIGH (ref 70–99)
Glucose-Capillary: 192 mg/dL — ABNORMAL HIGH (ref 70–99)
Glucose-Capillary: 198 mg/dL — ABNORMAL HIGH (ref 70–99)

## 2010-09-24 LAB — BASIC METABOLIC PANEL
BUN: 47 mg/dL — ABNORMAL HIGH (ref 6–23)
BUN: 57 mg/dL — ABNORMAL HIGH (ref 6–23)
BUN: 57 mg/dL — ABNORMAL HIGH (ref 6–23)
BUN: 57 mg/dL — ABNORMAL HIGH (ref 6–23)
CO2: 30 mEq/L (ref 19–32)
CO2: 31 mEq/L (ref 19–32)
Calcium: 9.2 mg/dL (ref 8.4–10.5)
Calcium: 9.4 mg/dL (ref 8.4–10.5)
Calcium: 9.5 mg/dL (ref 8.4–10.5)
Calcium: 9.5 mg/dL (ref 8.4–10.5)
Chloride: 94 mEq/L — ABNORMAL LOW (ref 96–112)
Chloride: 95 mEq/L — ABNORMAL LOW (ref 96–112)
Chloride: 97 mEq/L (ref 96–112)
Chloride: 97 mEq/L (ref 96–112)
Chloride: 98 mEq/L (ref 96–112)
Creatinine, Ser: 3.66 mg/dL — ABNORMAL HIGH (ref 0.4–1.2)
Creatinine, Ser: 3.84 mg/dL — ABNORMAL HIGH (ref 0.4–1.2)
Creatinine, Ser: 3.87 mg/dL — ABNORMAL HIGH (ref 0.4–1.2)
Creatinine, Ser: 4.17 mg/dL — ABNORMAL HIGH (ref 0.4–1.2)
GFR calc Af Amer: 13 mL/min — ABNORMAL LOW (ref >60)
GFR calc Af Amer: 13 mL/min — ABNORMAL LOW (ref >60)
GFR calc Af Amer: 14 mL/min — ABNORMAL LOW (ref >60)
GFR calc Af Amer: 16 mL/min — ABNORMAL LOW (ref >60)
GFR calc non Af Amer: 11 mL/min — ABNORMAL LOW (ref >60)
GFR calc non Af Amer: 13 mL/min — ABNORMAL LOW (ref >60)
Glucose, Bld: 122 mg/dL — ABNORMAL HIGH (ref 70–99)
Potassium: 5 mEq/L (ref 3.5–5.1)
Sodium: 137 mEq/L (ref 135–145)
Sodium: 140 mEq/L (ref 135–145)

## 2010-09-24 LAB — PHOSPHORUS: Phosphorus: 6 mg/dL — ABNORMAL HIGH (ref 2.3–4.6)

## 2010-09-24 LAB — RENAL FUNCTION PANEL
Albumin: 2.7 g/dL — ABNORMAL LOW (ref 3.5–5.2)
BUN: 48 mg/dL — ABNORMAL HIGH (ref 6–23)
BUN: 55 mg/dL — ABNORMAL HIGH (ref 6–23)
Chloride: 94 mEq/L — ABNORMAL LOW (ref 96–112)
Creatinine, Ser: 3.75 mg/dL — ABNORMAL HIGH (ref 0.4–1.2)
Creatinine, Ser: 4.28 mg/dL — ABNORMAL HIGH (ref 0.4–1.2)
GFR calc Af Amer: 12 mL/min — ABNORMAL LOW (ref >60)
GFR calc non Af Amer: 10 mL/min — ABNORMAL LOW (ref >60)
Glucose, Bld: 121 mg/dL — ABNORMAL HIGH (ref 70–99)
Phosphorus: 6.1 mg/dL — ABNORMAL HIGH (ref 2.3–4.6)
Phosphorus: 7.4 mg/dL — ABNORMAL HIGH (ref 2.3–4.6)
Potassium: 5 mEq/L (ref 3.5–5.1)

## 2010-09-24 LAB — DIFFERENTIAL
Basophils Absolute: 0.1 10*3/uL (ref 0.0–0.1)
Basophils Relative: 1 % (ref 0–1)
Eosinophils Absolute: 0.4 10*3/uL (ref 0.0–0.7)
Eosinophils Relative: 4 % (ref 0–5)
Neutrophils Relative %: 66 % (ref 43–77)

## 2010-09-24 LAB — BRAIN NATRIURETIC PEPTIDE
Pro B Natriuretic peptide (BNP): 38.4 pg/mL (ref 0.0–100.0)
Pro B Natriuretic peptide (BNP): 409 pg/mL — ABNORMAL HIGH (ref 0.0–100.0)
Pro B Natriuretic peptide (BNP): 52.6 pg/mL (ref 0.0–100.0)
Pro B Natriuretic peptide (BNP): 85.7 pg/mL (ref 0.0–100.0)

## 2010-09-24 LAB — HEPATITIS B SURFACE ANTIGEN: Hepatitis B Surface Ag: NEGATIVE

## 2010-09-24 LAB — PROTIME-INR
INR: 1 (ref 0.00–1.49)
Prothrombin Time: 13.4 seconds (ref 11.6–15.2)

## 2010-09-24 LAB — HEMOGLOBIN A1C
Hgb A1c MFr Bld: 6.1 % (ref 4.6–6.1)
Mean Plasma Glucose: 128 mg/dL

## 2010-09-24 LAB — MAGNESIUM: Magnesium: 2 mg/dL (ref 1.5–2.5)

## 2010-09-25 LAB — CROSSMATCH
ABO/RH(D): A POS
Antibody Screen: NEGATIVE

## 2010-09-25 LAB — LIPID PANEL
Cholesterol: 153 mg/dL (ref 0–200)
Total CHOL/HDL Ratio: 11.8 RATIO

## 2010-09-25 LAB — COMPREHENSIVE METABOLIC PANEL
ALT: 28 U/L (ref 0–35)
ALT: 52 U/L — ABNORMAL HIGH (ref 0–35)
AST: 21 U/L (ref 0–37)
AST: 47 U/L — ABNORMAL HIGH (ref 0–37)
Albumin: 2 g/dL — ABNORMAL LOW (ref 3.5–5.2)
Albumin: 2 g/dL — ABNORMAL LOW (ref 3.5–5.2)
Albumin: 2 g/dL — ABNORMAL LOW (ref 3.5–5.2)
Albumin: 2.1 g/dL — ABNORMAL LOW (ref 3.5–5.2)
Alkaline Phosphatase: 76 U/L (ref 39–117)
Alkaline Phosphatase: 76 U/L (ref 39–117)
BUN: 53 mg/dL — ABNORMAL HIGH (ref 6–23)
BUN: 54 mg/dL — ABNORMAL HIGH (ref 6–23)
BUN: 59 mg/dL — ABNORMAL HIGH (ref 6–23)
BUN: 69 mg/dL — ABNORMAL HIGH (ref 6–23)
CO2: 24 mEq/L (ref 19–32)
CO2: 30 mEq/L (ref 19–32)
CO2: 30 mEq/L (ref 19–32)
Calcium: 7.8 mg/dL — ABNORMAL LOW (ref 8.4–10.5)
Calcium: 8.6 mg/dL (ref 8.4–10.5)
Chloride: 102 mEq/L (ref 96–112)
Chloride: 102 mEq/L (ref 96–112)
Chloride: 97 mEq/L (ref 96–112)
Chloride: 99 mEq/L (ref 96–112)
Creatinine, Ser: 3.23 mg/dL — ABNORMAL HIGH (ref 0.4–1.2)
GFR calc Af Amer: 18 mL/min — ABNORMAL LOW (ref >60)
GFR calc non Af Amer: 14 mL/min — ABNORMAL LOW (ref >60)
GFR calc non Af Amer: 15 mL/min — ABNORMAL LOW (ref >60)
Glucose, Bld: 107 mg/dL — ABNORMAL HIGH (ref 70–99)
Glucose, Bld: 132 mg/dL — ABNORMAL HIGH (ref 70–99)
Glucose, Bld: 152 mg/dL — ABNORMAL HIGH (ref 70–99)
Potassium: 3.7 mEq/L (ref 3.5–5.1)
Potassium: 3.7 mEq/L (ref 3.5–5.1)
Potassium: 4.3 mEq/L (ref 3.5–5.1)
Sodium: 133 mEq/L — ABNORMAL LOW (ref 135–145)
Sodium: 138 mEq/L (ref 135–145)
Sodium: 141 mEq/L (ref 135–145)
Total Bilirubin: 0.6 mg/dL (ref 0.3–1.2)
Total Bilirubin: 0.6 mg/dL (ref 0.3–1.2)
Total Bilirubin: 0.8 mg/dL (ref 0.3–1.2)
Total Protein: 5.4 g/dL — ABNORMAL LOW (ref 6.0–8.3)

## 2010-09-25 LAB — CBC
HCT: 26.9 % — ABNORMAL LOW (ref 36.0–46.0)
HCT: 27 % — ABNORMAL LOW (ref 36.0–46.0)
HCT: 27.2 % — ABNORMAL LOW (ref 36.0–46.0)
HCT: 27.4 % — ABNORMAL LOW (ref 36.0–46.0)
HCT: 28.4 % — ABNORMAL LOW (ref 36.0–46.0)
HCT: 28.4 % — ABNORMAL LOW (ref 36.0–46.0)
HCT: 28.6 % — ABNORMAL LOW (ref 36.0–46.0)
HCT: 32.7 % — ABNORMAL LOW (ref 36.0–46.0)
Hemoglobin: 9.3 g/dL — ABNORMAL LOW (ref 12.0–15.0)
Hemoglobin: 9.4 g/dL — ABNORMAL LOW (ref 12.0–15.0)
Hemoglobin: 9.4 g/dL — ABNORMAL LOW (ref 12.0–15.0)
Hemoglobin: 9.8 g/dL — ABNORMAL LOW (ref 12.0–15.0)
MCHC: 33.1 g/dL (ref 30.0–36.0)
MCHC: 33.2 g/dL (ref 30.0–36.0)
MCHC: 33.2 g/dL (ref 30.0–36.0)
MCHC: 33.2 g/dL (ref 30.0–36.0)
MCHC: 33.7 g/dL (ref 30.0–36.0)
MCHC: 34.3 g/dL (ref 30.0–36.0)
MCV: 87 fL (ref 78.0–100.0)
MCV: 88.1 fL (ref 78.0–100.0)
MCV: 88.1 fL (ref 78.0–100.0)
MCV: 88.4 fL (ref 78.0–100.0)
MCV: 88.6 fL (ref 78.0–100.0)
MCV: 88.6 fL (ref 78.0–100.0)
MCV: 88.7 fL (ref 78.0–100.0)
Platelets: 279 10*3/uL (ref 150–400)
Platelets: 298 10*3/uL (ref 150–400)
Platelets: 300 10*3/uL (ref 150–400)
Platelets: 333 10*3/uL (ref 150–400)
Platelets: 354 10*3/uL (ref 150–400)
Platelets: 382 10*3/uL (ref 150–400)
RBC: 3.07 MIL/uL — ABNORMAL LOW (ref 3.87–5.11)
RBC: 3.11 MIL/uL — ABNORMAL LOW (ref 3.87–5.11)
RBC: 3.2 MIL/uL — ABNORMAL LOW (ref 3.87–5.11)
RBC: 3.28 MIL/uL — ABNORMAL LOW (ref 3.87–5.11)
RBC: 3.42 MIL/uL — ABNORMAL LOW (ref 3.87–5.11)
RDW: 15.3 % (ref 11.5–15.5)
RDW: 15.6 % — ABNORMAL HIGH (ref 11.5–15.5)
RDW: 15.9 % — ABNORMAL HIGH (ref 11.5–15.5)
RDW: 16.4 % — ABNORMAL HIGH (ref 11.5–15.5)
WBC: 11.6 10*3/uL — ABNORMAL HIGH (ref 4.0–10.5)
WBC: 13.6 10*3/uL — ABNORMAL HIGH (ref 4.0–10.5)
WBC: 17.1 10*3/uL — ABNORMAL HIGH (ref 4.0–10.5)
WBC: 18.9 10*3/uL — ABNORMAL HIGH (ref 4.0–10.5)
WBC: 19.5 10*3/uL — ABNORMAL HIGH (ref 4.0–10.5)
WBC: 19.7 10*3/uL — ABNORMAL HIGH (ref 4.0–10.5)

## 2010-09-25 LAB — BASIC METABOLIC PANEL
BUN: 52 mg/dL — ABNORMAL HIGH (ref 6–23)
BUN: 62 mg/dL — ABNORMAL HIGH (ref 6–23)
BUN: 90 mg/dL — ABNORMAL HIGH (ref 6–23)
CO2: 26 mEq/L (ref 19–32)
CO2: 27 mEq/L (ref 19–32)
CO2: 29 mEq/L (ref 19–32)
CO2: 30 mEq/L (ref 19–32)
Calcium: 8.2 mg/dL — ABNORMAL LOW (ref 8.4–10.5)
Calcium: 8.3 mg/dL — ABNORMAL LOW (ref 8.4–10.5)
Chloride: 101 mEq/L (ref 96–112)
Chloride: 102 mEq/L (ref 96–112)
Chloride: 104 mEq/L (ref 96–112)
Chloride: 99 mEq/L (ref 96–112)
Creatinine, Ser: 2.64 mg/dL — ABNORMAL HIGH (ref 0.4–1.2)
Creatinine, Ser: 3.01 mg/dL — ABNORMAL HIGH (ref 0.4–1.2)
Creatinine, Ser: 3.27 mg/dL — ABNORMAL HIGH (ref 0.4–1.2)
Creatinine, Ser: 3.37 mg/dL — ABNORMAL HIGH (ref 0.4–1.2)
GFR calc Af Amer: 17 mL/min — ABNORMAL LOW (ref >60)
GFR calc Af Amer: 22 mL/min — ABNORMAL LOW (ref >60)
GFR calc non Af Amer: 13 mL/min — ABNORMAL LOW (ref >60)
Glucose, Bld: 121 mg/dL — ABNORMAL HIGH (ref 70–99)
Glucose, Bld: 140 mg/dL — ABNORMAL HIGH (ref 70–99)
Glucose, Bld: 168 mg/dL — ABNORMAL HIGH (ref 70–99)
Potassium: 4.8 mEq/L (ref 3.5–5.1)
Potassium: 5.1 mEq/L (ref 3.5–5.1)
Sodium: 134 mEq/L — ABNORMAL LOW (ref 135–145)
Sodium: 139 mEq/L (ref 135–145)

## 2010-09-25 LAB — GLUCOSE, CAPILLARY
Glucose-Capillary: 119 mg/dL — ABNORMAL HIGH (ref 70–99)
Glucose-Capillary: 127 mg/dL — ABNORMAL HIGH (ref 70–99)
Glucose-Capillary: 128 mg/dL — ABNORMAL HIGH (ref 70–99)
Glucose-Capillary: 132 mg/dL — ABNORMAL HIGH (ref 70–99)
Glucose-Capillary: 133 mg/dL — ABNORMAL HIGH (ref 70–99)
Glucose-Capillary: 135 mg/dL — ABNORMAL HIGH (ref 70–99)
Glucose-Capillary: 144 mg/dL — ABNORMAL HIGH (ref 70–99)
Glucose-Capillary: 144 mg/dL — ABNORMAL HIGH (ref 70–99)
Glucose-Capillary: 152 mg/dL — ABNORMAL HIGH (ref 70–99)
Glucose-Capillary: 165 mg/dL — ABNORMAL HIGH (ref 70–99)
Glucose-Capillary: 172 mg/dL — ABNORMAL HIGH (ref 70–99)
Glucose-Capillary: 172 mg/dL — ABNORMAL HIGH (ref 70–99)
Glucose-Capillary: 178 mg/dL — ABNORMAL HIGH (ref 70–99)
Glucose-Capillary: 178 mg/dL — ABNORMAL HIGH (ref 70–99)
Glucose-Capillary: 180 mg/dL — ABNORMAL HIGH (ref 70–99)
Glucose-Capillary: 193 mg/dL — ABNORMAL HIGH (ref 70–99)
Glucose-Capillary: 229 mg/dL — ABNORMAL HIGH (ref 70–99)
Glucose-Capillary: 83 mg/dL (ref 70–99)

## 2010-09-25 LAB — RENAL FUNCTION PANEL
Albumin: 2.2 g/dL — ABNORMAL LOW (ref 3.5–5.2)
CO2: 28 mEq/L (ref 19–32)
Calcium: 9 mg/dL (ref 8.4–10.5)
Creatinine, Ser: 3.34 mg/dL — ABNORMAL HIGH (ref 0.4–1.2)
GFR calc Af Amer: 17 mL/min — ABNORMAL LOW (ref >60)
GFR calc non Af Amer: 14 mL/min — ABNORMAL LOW (ref >60)

## 2010-09-25 LAB — BLOOD GAS, ARTERIAL
Acid-Base Excess: 0.7 mmol/L (ref 0.0–2.0)
Acid-base deficit: 1.5 mmol/L (ref 0.0–2.0)
Bicarbonate: 23.5 mEq/L (ref 20.0–24.0)
Bicarbonate: 27.9 mEq/L — ABNORMAL HIGH (ref 20.0–24.0)
Bicarbonate: 28 mEq/L — ABNORMAL HIGH (ref 20.0–24.0)
Delivery systems: POSITIVE
Expiratory PAP: 5
FIO2: 0.5 %
FIO2: 0.6 %
Inspiratory PAP: 10
Mode: POSITIVE
O2 Saturation: 95.2 %
O2 Saturation: 95.5 %
Patient temperature: 98.6
Patient temperature: 98.6
TCO2: 22 mmol/L (ref 0–100)
TCO2: 23.3 mmol/L (ref 0–100)
TCO2: 24.5 mmol/L (ref 0–100)
TCO2: 26.1 mmol/L (ref 0–100)
pH, Arterial: 7.351 (ref 7.350–7.400)
pH, Arterial: 7.37 (ref 7.350–7.400)
pH, Arterial: 7.417 — ABNORMAL HIGH (ref 7.350–7.400)
pO2, Arterial: 79.7 mmHg — ABNORMAL LOW (ref 80.0–100.0)
pO2, Arterial: 82.2 mmHg (ref 80.0–100.0)
pO2, Arterial: 86.8 mmHg (ref 80.0–100.0)

## 2010-09-25 LAB — URINE CULTURE: Special Requests: NEGATIVE

## 2010-09-25 LAB — URINALYSIS, MICROSCOPIC ONLY
Glucose, UA: NEGATIVE mg/dL
Ketones, ur: NEGATIVE mg/dL
Leukocytes, UA: NEGATIVE
Nitrite: NEGATIVE
Specific Gravity, Urine: 1.008 (ref 1.005–1.030)
pH: 5.5 (ref 5.0–8.0)

## 2010-09-25 LAB — DIFFERENTIAL
Basophils Absolute: 0 10*3/uL (ref 0.0–0.1)
Basophils Absolute: 0.1 10*3/uL (ref 0.0–0.1)
Basophils Relative: 0 % (ref 0–1)
Basophils Relative: 0 % (ref 0–1)
Basophils Relative: 0 % (ref 0–1)
Basophils Relative: 1 % (ref 0–1)
Eosinophils Absolute: 0.1 10*3/uL (ref 0.0–0.7)
Eosinophils Absolute: 0.4 10*3/uL (ref 0.0–0.7)
Eosinophils Absolute: 0.5 10*3/uL (ref 0.0–0.7)
Eosinophils Relative: 0 % (ref 0–5)
Eosinophils Relative: 4 % (ref 0–5)
Lymphs Abs: 0.9 10*3/uL (ref 0.7–4.0)
Lymphs Abs: 2 10*3/uL (ref 0.7–4.0)
Monocytes Absolute: 0.9 10*3/uL (ref 0.1–1.0)
Monocytes Absolute: 1 10*3/uL (ref 0.1–1.0)
Monocytes Relative: 7 % (ref 3–12)
Neutro Abs: 18.3 10*3/uL — ABNORMAL HIGH (ref 1.7–7.7)
Neutro Abs: 9.8 10*3/uL — ABNORMAL HIGH (ref 1.7–7.7)
Neutrophils Relative %: 70 % (ref 43–77)
Neutrophils Relative %: 72 % (ref 43–77)
Neutrophils Relative %: 91 % — ABNORMAL HIGH (ref 43–77)
Neutrophils Relative %: 94 % — ABNORMAL HIGH (ref 43–77)

## 2010-09-25 LAB — HEMOCCULT GUIAC POC 1CARD (OFFICE): Fecal Occult Bld: NEGATIVE

## 2010-09-25 LAB — POCT I-STAT, CHEM 8
BUN: 32 mg/dL — ABNORMAL HIGH (ref 6–23)
Calcium, Ion: 1.05 mmol/L — ABNORMAL LOW (ref 1.12–1.32)
Creatinine, Ser: 2.7 mg/dL — ABNORMAL HIGH (ref 0.4–1.2)
Hemoglobin: 12.2 g/dL (ref 12.0–15.0)
TCO2: 27 mmol/L (ref 0–100)

## 2010-09-25 LAB — MAGNESIUM
Magnesium: 1.5 mg/dL (ref 1.5–2.5)
Magnesium: 1.6 mg/dL (ref 1.5–2.5)
Magnesium: 2 mg/dL (ref 1.5–2.5)

## 2010-09-25 LAB — BRAIN NATRIURETIC PEPTIDE
Pro B Natriuretic peptide (BNP): 1020 pg/mL — ABNORMAL HIGH (ref 0.0–100.0)
Pro B Natriuretic peptide (BNP): 125 pg/mL — ABNORMAL HIGH (ref 0.0–100.0)
Pro B Natriuretic peptide (BNP): 146 pg/mL — ABNORMAL HIGH (ref 0.0–100.0)
Pro B Natriuretic peptide (BNP): 173 pg/mL — ABNORMAL HIGH (ref 0.0–100.0)
Pro B Natriuretic peptide (BNP): 272 pg/mL — ABNORMAL HIGH (ref 0.0–100.0)
Pro B Natriuretic peptide (BNP): 699 pg/mL — ABNORMAL HIGH (ref 0.0–100.0)
Pro B Natriuretic peptide (BNP): 728 pg/mL — ABNORMAL HIGH (ref 0.0–100.0)

## 2010-09-25 LAB — URINALYSIS, ROUTINE W REFLEX MICROSCOPIC
Ketones, ur: NEGATIVE mg/dL
Nitrite: NEGATIVE
Specific Gravity, Urine: 1.009 (ref 1.005–1.030)
pH: 5.5 (ref 5.0–8.0)

## 2010-09-25 LAB — POCT CARDIAC MARKERS
Myoglobin, poc: >500 ng/mL (ref 12–200)
Troponin i, poc: 0.6 ng/mL (ref 0.00–0.09)

## 2010-09-25 LAB — URINE MICROSCOPIC-ADD ON

## 2010-09-25 LAB — CULTURE, BLOOD (ROUTINE X 2)

## 2010-09-25 LAB — CK TOTAL AND CKMB (NOT AT ARMC)
CK, MB: 12.1 ng/mL — ABNORMAL HIGH (ref 0.3–4.0)
CK, MB: 9 ng/mL — ABNORMAL HIGH (ref 0.3–4.0)
Relative Index: 3.9 — ABNORMAL HIGH (ref 0.0–2.5)
Relative Index: 5.2 — ABNORMAL HIGH (ref 0.0–2.5)
Total CK: 199 U/L — ABNORMAL HIGH (ref 7–177)
Total CK: 234 U/L — ABNORMAL HIGH (ref 7–177)

## 2010-09-25 LAB — PHOSPHORUS
Phosphorus: 4.9 mg/dL — ABNORMAL HIGH (ref 2.3–4.6)
Phosphorus: 5.6 mg/dL — ABNORMAL HIGH (ref 2.3–4.6)

## 2010-09-25 LAB — PROTIME-INR: Prothrombin Time: 14.7 seconds (ref 11.6–15.2)

## 2010-09-25 LAB — IRON AND TIBC: Iron: 68 ug/dL (ref 42–135)

## 2010-09-25 LAB — HEPARIN LEVEL (UNFRACTIONATED)
Heparin Unfractionated: 0.11 IU/mL — ABNORMAL LOW (ref 0.30–0.70)
Heparin Unfractionated: 0.47 IU/mL (ref 0.30–0.70)
Heparin Unfractionated: 0.52 IU/mL (ref 0.30–0.70)
Heparin Unfractionated: 0.53 IU/mL (ref 0.30–0.70)
Heparin Unfractionated: 0.73 IU/mL — ABNORMAL HIGH (ref 0.30–0.70)

## 2010-09-25 LAB — CLOSTRIDIUM DIFFICILE EIA: C difficile Toxins A+B, EIA: NEGATIVE

## 2010-09-25 LAB — TROPONIN I
Troponin I: 0.43 ng/mL — ABNORMAL HIGH (ref 0.00–0.06)
Troponin I: 1.17 ng/mL (ref 0.00–0.06)
Troponin I: 2.17 ng/mL (ref 0.00–0.06)

## 2010-09-25 LAB — TSH: TSH: 1.151 u[IU]/mL (ref 0.350–4.500)

## 2010-10-12 ENCOUNTER — Encounter: Payer: Self-pay | Admitting: Internal Medicine

## 2010-10-15 ENCOUNTER — Ambulatory Visit (INDEPENDENT_AMBULATORY_CARE_PROVIDER_SITE_OTHER): Payer: Medicare Other | Admitting: Internal Medicine

## 2010-10-15 ENCOUNTER — Encounter: Payer: Self-pay | Admitting: Internal Medicine

## 2010-10-15 VITALS — BP 132/80 | HR 85 | Ht 62.0 in | Wt 193.6 lb

## 2010-10-15 DIAGNOSIS — J984 Other disorders of lung: Secondary | ICD-10-CM

## 2010-10-15 DIAGNOSIS — J449 Chronic obstructive pulmonary disease, unspecified: Secondary | ICD-10-CM

## 2010-10-15 NOTE — Progress Notes (Signed)
  Subjective:    Patient ID: [name], female    DOB: [birth year], 70 y.o.   MRN: [id number]  HPI 70 yo F former smoker followed for COPD and hx lung nodule complicated by CAD, HTN, CHF, now with renal failure, but not yet on dialysis- Dr Hyman Hopes.  Here with husband. Last here April 16, 2010. Hx EF 40-45% as noted in my notes reviewed from last visit.  She notes dyspnea on chronic basis and doesn't feel she could skip O2, but denies chest pain, palpitation, phlegm,  Blood or ankle edema. O2 stays 1.5 to 2 L/M. She hasn't used rescue inhaler in a long time.    Review of Systems See HPI Constitutional:   No weight loss, night sweats,  Fevers, chills, fatigue, lassitude. HEENT:   No headaches,  Difficulty swallowing,  Tooth/dental problems,  Sore throat,                No sneezing, itching, ear ache, nasal congestion, post nasal drip,   CV:  No chest pain,  Orthopnea, PND, swelling in lower extremities, anasarca, dizziness, palpitations  GI  No heartburn, indigestion, abdominal pain, nausea, vomiting, diarrhea, change in bowel habits, loss of appetite  Resp:   No excess mucus, no productive cough,  No non-productive cough,  No coughing up of blood.  No change in color of mucus.  No wheezing.  No chest wall deformity  Skin: no rash or lesions.  GU: no dysuria, change in color of urine, no urgency or frequency.  No flank pain.  MS:  No joint pain or swelling.  No decreased range of motion.  No back pain.  Psych:  No change in mood or affect. No depression or anxiety.  No memory loss.      Objective:   Physical Exam General- Alert, Oriented, Affect-appropriate, Distress- none acute         Wheelchair  overweight  Skin- rash-none, lesions- none, excoriation- none  Lymphadenopathy- none  Head- atraumatic  Eyes- Gross vision intact, PERRLA, conjunctivae clear, secretions  Ears- Normal- earing, canals, Tm L , R ,  Nose- Clear,  No-Septal dev, mucus, polyps, erosion,  perforation   Throat- Mallampati II , mucosa clear , drainage- none, tonsils- atrophic  Edentulous  Neck- flexible , trachea midline, no stridor , thyroid nl, carotid no bruit  Chest - symmetrical excursion , unlabored     Heart/CV- RRR , no murmur , no gallop  , no rub, nl s1 s2                     - JVD- none , edema- none, stasis changes- none, varices- none     Lung- clear to P&A, diminished.         wheeze- none, cough- none , dullness-none, rub- none     Chest wall-   Abd- tender-no, distended-no, bowel sounds-present, HSM- no  Br/ Gen/ Rectal- Not done, not indicated  Extrem- cyanosis- none, clubbing, none, atrophy- none, strength- nl  Neuro- grossly intact to observation         Assessment & Plan:

## 2010-10-15 NOTE — Assessment & Plan Note (Addendum)
Dyspnea reflects cardiac and pulmonary limitations and anemia for which she gets Procrit,  as well as deconditioning. We discussed this. She remains O2 dependent. The primary factor is COPD with little reversibility.

## 2010-10-15 NOTE — Patient Instructions (Signed)
Please call as needed 

## 2010-10-19 ENCOUNTER — Encounter: Payer: Self-pay | Admitting: Internal Medicine

## 2010-10-19 NOTE — Assessment & Plan Note (Signed)
Reviewed CT chest from 04/16/10- compared with 2010, there are multiple stable nonspecific nodules. As discussed w/ her, we will follow, but these will probably be benign.

## 2010-10-30 NOTE — Consult Note (Signed)
NAMECHERRON, [name]            ACCOUNT NO.:  [id number]   MEDICAL RECORD NO.:  [id number]          PATIENT TYPE:  INP   LOCATION:  1416                         FACILITY:  [facility]   PHYSICIAN:  Quita Skye. Hart Rochester, M.D.  DATE OF BIRTH:  1940-11-09   DATE OF CONSULTATION:  DATE OF DISCHARGE:                                 CONSULTATION   Ms. Deskins is a 70 year old female admitted to [facility] on  Nov 04, 2008 with acute shortness of breath with cough, fever, chills  over 5 days.  She had a history of asthma with occasional oxygen at  night.  She was treated for congestive heart failure and possible  pneumonia and was also thought to have a small myocardial infarction.  She did not undergo cardiac catheterization because of her renal  function.  She has chronic renal insufficiency, but has not been on  hemodialysis and also has a history of hypertension and COPD.  Her  medical situation has improved during this hospitalization and she is  now being evaluated for possible vascular access.  She is right-handed  and her creatinine is 3.56 with a BUN of 52.   PHYSICAL EXAMINATION:  VITAL SIGNS:  Blood pressure is 150/70, heart  rate 80, respirations 14, temperature 98.7.  GENERAL:  She is alert and oriented x3.  NECK:  Supple with 3+ carotid pulses palpable.  EXTREMITIES:  3+ brachial and radial pulses.  Both arms are large with  no obvious surface veins readily visible.  Vein mapping was performed in  the left upper extremity, which revealed a very small and non-usable  left upper arm cephalic vein.  The left forearm cephalic vein  potentially is usable and does communicate with the basilic vein at the  antecubital area, although on physical exam, the cephalic vein is not  visible.  She does have excellent arterial circulation bilaterally.   IMPRESSION:  Chronic renal insufficiency with worsening renal function.  We will need vascular access in left upper extremity as soon as  she is  cleared from a cardiac standpoint and the Nephrology Service is ready  for [name] to proceed.  I discussed this with Dr. Caryn Section today who said not to  place access at this time, but to wait until we are notified.  At that  time, she will have access scheduled as either a fistula in the left  forearm or left forearm arteriovenous graft.  She is not a candidate for  left upper arm fistula.  Discussed this with the patient and her  daughter and will wait for Dr. Scherrie Gerlach office to notify us to schedule the  patient.      Quita Skye Hart Rochester, M.D.  Electronically Signed     JDL/MEDQ  D:  11/16/2008  T:  11/17/2008  Job:  [id number]

## 2010-10-30 NOTE — Consult Note (Signed)
[name], [name]            ACCOUNT NO.:  [id number]   MEDICAL RECORD NO.:  [id number]          PATIENT TYPE:  INP   LOCATION:  1235                         FACILITY:  [facility]   PHYSICIAN:  Maree Krabbe, M.D.DATE OF BIRTH:  04-05-1941   DATE OF CONSULTATION:  11/06/2008  DATE OF DISCHARGE:                                 CONSULTATION   PRIMARY DOCTOR:  Dr. Della Goo.   PRIMARY CARE PHYSICIAN:  [facility].   NEPHROLOGIST:  Dr. Hyman Hopes.   PRESENTING COMPLAINT:  Shortness of breath.   HISTORY:  This is a 70 year old white female who was sent to the  emergency room from [facility] by ambulance due to worsening and  progressive shortness of breath over a several-day history.  She has  also been having some subjective fever, cough and chills.  She was  started on antibiotic the day prior to admission without any  improvement.  She does have a history of asthma and/or COPD, quit  smoking 7 years ago.  She is a heavy smoker prior to that.   When she arrived in the emergency room, she required BiPAP therapy and  was given IV steroids.  Creatinine was 3.0 on admission.   The patient followup with Dr. Hyman Hopes for chronic kidney disease for the  last about 8 to 12 months.  She has not been told that she will require  dialysis and she has not had a vascular access placed.  Looking in the  computer records, the last creatinine was in July 2009 which was 2.0 at  that time.  It was 3.0 on admission here today.   PAST MEDICAL HISTORY:  1. Asthma/COPD.  2. Chronic kidney disease.  3. Hypertension.   HOME MEDICATIONS:  1. Torsemide 20 mg two to three times a day.  2. Zaroxolyn which she is not currently taking.  3. Pulmicort.  4. Nitro-Dur patch.  5. Lovaza.  6. Mirapex.  7. Levothyroxine.  8. Cymbalta.  9. Zetia.  10.Singulair.  11.Advair.  12.Norvasc.  13.Nexium.  14.Welchol.  15.Oxygen.   INPATIENT MEDICATIONS:  1. Albuterol.  2.  Amiodarone.  3. Norvasc 5 once a day.  4. Aspirin 325 daily.  5. Cymbalta 30 daily.  6. Zetia 10 daily.  7. Diflucan 100 daily.  8. Lasix 40 mg, she has gotten one or two doses of this.  9. She is on IV heparin protocol.  10.Sliding-scale of insulin.  11.Atrovent nebs.  12.Synthroid.  13.IV Solu-Medrol 60 q.8 hours.  14.Singulair 10 mg daily.  15.Moxifloxacin 400 mg IV q. 24.  16.Nitroglycerin drip.  17.Lovaza 1 gram daily.  18.Protonix 80 mg daily.  19.Zocor 40 mg at night.  20.P.r.n. medications.   ALLERGIES:  KEFLEX and FLAGYL.   SOCIAL HISTORY:  Former smoker, quit 7 years ago.  No alcohol.  She  lives with her husband.  She has a grown children here with her today.   REVIEW OF SYSTEMS:  CONSTITUTIONAL:  Denies any current fever, chills  and sweats or weight loss.  ENT:  Denies any hearing loss, visual  change, sore throat, difficulty swallowing.  Cardiorespiratory denies  any hemoptysis or purulent sputum production.  She does have shortness  of breath and was taken off of BiPAP today.  Denies chest pain.  Denies  history of heart attack or congestive heart failure in the past.  GI:  Denies nausea, vomiting, abdominal pain and constipation.  GU:  Denies  any difficulty voiding at home.  No dysuria or urinary frequency.  MUSCULOSKELETAL:  Denies myalgia, arthralgia.  She says her ankle  swelling in the legs is not as bad as it usually is.  NEUROLOGIC:  No  history of stroke, TIA, seizure focal numbness or weakness.   PHYSICAL EXAM:  VITAL SIGNS:  Blood pressure is 140/70, heart rate 110,  sinus tachycardia, temperature 97.8.  I do not see where she has had a  documented fever.  In the office, her temperature was 98.8 on the day of  admission.  CONSTITUTIONAL:  This is an overweight middle-aged female.  She is  breathing 25 times a minute, not in serious distress.  She is short of  breath and with increased work of breathing.  SKIN:  Without rash.  HEENT:  PERRLA,  EOMI.  Throat is clear.  Moist.  NECK:  Supple and she has mildly increased JVP.  CHEST:  Bibasilar crackles at the bases.  CARDIAC:  Regular rate and rhythm, tachycardia without murmur, rub or  gallop.  ABDOMEN:  Soft, nontender, active bowel sounds.  No ascites.  No  organomegaly.  EXTREMITIES:  She has moderate lower extremity edema over 1 to 2+ from  the knees to the feet, but nothing in the thighs or the abdominal wall.  NEUROLOGIC:  Alert and oriented x3 and nonfocal motor exam.   LABS:  Sodium 137, potassium 4.2, CO2 27, BUN 62, creatinine 3.01,  hemoglobin 11, white blood count 22,000, platelets 382,000.  Blood gas  7.35/43/82.  Chest x-ray shows what looks like pulmonary edema with  perihilar edema and vascular congestion.  Film on May 23 was slightly  improved compared to the May 21 film.  There were some small bilateral  pleural effusions also.   IMPRESSION:  1. Chronic kidney disease,  uncertain baseline.  Will get office      records tomorrow.  Most likely has chronic kidney disease due to      hypertension and most likely diabetes.  Baseline creatinine is at      least higher than 2.  Creatinine here today is 3.0.  Patient is      quite possibly going to require heart catheterization within the      next 48 hours and we are asked to see the patient to indicate if      she needs dialysis and for assistance with following her kidney      disease.  2. Respiratory distress, looks like pulmonary edema with elevated      cardiac enzyme, possible myocardial infarction, and possible      pneumonia.  She also has underlying chronic obstructive pulmonary      disease.  There is no wheezing today.  She does have volume      overload on exam and I would concur with the diagnosis of      congestive heart failure.  She is on empiric therapy for possible      pneumonia.  3. Hypertension, well controlled.  4. Diabetes mellitus.  She was taking oral hypoglycemic agents, but       those were stopped recently due to hypoglycemia.  5. Positive troponin, possible heart cath per cardiology soon.  6. Volume excess.  7. Anemia most likely of chronic kidney disease.   RECOMMENDATIONS:  If the patient requires heart catheterization, would  limit dye to is a little as possible and pre-treat with Mucomyst 600 mg  b.i.d.  I will start Mucomyst today.  I doubt that we can give her much  in terms of IV fluids because she already has some pulmonary edema.  Will increase her Lasix to 80 mg and give 2 to 3 times a day.  Follow  her clinical course.  Will get office records in the morning.      Maree Krabbe, M.D.  Electronically Signed     RDS/MEDQ  D:  11/06/2008  T:  11/06/2008  Job:  [id number]

## 2010-10-30 NOTE — Consult Note (Signed)
[name], [name]            ACCOUNT NO.:  [id number]   MEDICAL RECORD NO.:  [id number]          PATIENT TYPE:  INP   LOCATION:  1235                         FACILITY:  [facility]   PHYSICIAN:  Unice Cobble, MD     DATE OF BIRTH:  08-13-1940   DATE OF CONSULTATION:  11/04/2008  DATE OF DISCHARGE:                                 CONSULTATION   CHIEF COMPLAINT:  Shortness of breath.   HISTORY OF PRESENT ILLNESS:  This is a 70 year old white female who  presents with shortness of breath.  The patient has a history of COPD  and chronic kidney disease, but no known heart history.  She has had  fevers and chills, a productive yellow/bloody cough, and shortness of  breath for approximately one week.  No chest pain or palpitations.  No  dyspnea on exertion.  She has edema in her lower extremities, but she  tells me this is nothing out of her usual state.  She has never been  diagnosed with congestive heart failure, and has been told that her  edema is due to chronic kidney disease.  She manages this well with a  single torsemide a day.  She is currently not taking metolazone.  She  was diagnosed with pneumonia yesterday at an outside hospital, and was  given antibiotics.  The antibiotics did not help.  She comes in today  short of breath, looking for help.  She denies orthopnea or PND.   PAST MEDICAL HISTORY:  1. Chronic kidney disease.  2. Hypertension.  3. Lower extremity edema.  4. Hyperlipidemia.  5. COPD.  6. Diabetes.  7. Hypothyroid.  8. History of back surgery.  9. History of tubal ligation.  10.History of jaw surgery.   ALLERGIES:  FLAGYL and KEFLEX.   MEDICATIONS:  1. Pulmicort.  2. Nitro-Dur 0.4 mg per hour transdermal every day.  3. Lovaza 1 gram q.i.d.  4. Mirapex 0.5 mg half a tablet daily.  5. Torsemide 20 mg two to three pills per day (she is currently taken      one pill per day).  6. Levothyroxine.  7. Cymbalta.  8. Zetia 10 mg daily.  9. Singulair.  10.Advair.  11.Amlodipine 5 mg daily.  12.Nexium 40 mg daily.  13.MiraLax.  14.Loratadine.  15.Metolazone 2.5 mg p.r.n.  16.Welchol.   SOCIAL HISTORY:  She lives in [place] with her husband.  She quit  smoking 8 years ago.  No alcohol.   FAMILY HISTORY:  Noncontributory for myocardial infarction or strokes.   REVIEW OF SYSTEMS:  Complete review of systems done and found to be  otherwise negative, except as stated the HPI.   PHYSICAL EXAMINATION:  Pulse 115, respiratory rate 41 coming down to 34  on BiPAP, blood pressure 187/91 coming down to 124/63 with transdermal  nitroglycerin, and her O2 saturations are 99% on BiPAP.  GENERAL:  She is tachypneic and obese.  HEENT:  Shows PERRLA, EOMI.  NECK:  Supple without lymphadenopathy, thyromegaly, bruits or jugular  venous distention.  HEART:  Has a regular rate and rhythm with normal S1 and S2; without  murmurs, gallops or rubs.  Pulses are 2+ and equal bilaterally without bruits in all 4 extremities.  LUNGS:  Have clear to auscultation on the right.  She has crackles and  bronchial sounds of the left lower lobe, and occasional wheezes  throughout.  ABDOMEN:  Soft and nontender with normal bowel sounds.  No rebound or  guarding.  It is tympanic and there is no fluid at present.  EXTREMITIES:  Show no cyanosis or clubbing.  She has 1+ edema  bilaterally.  MUSCULOSKELETAL:  Shows no joint deformities, effusions,  or spine or CVA tenderness.  NEUROLOGICAL:  She is alert and oriented x3 with cranial nerves II-XII  grossly intact.   RADIOLOGY:  Shows chest x-ray with cardiomegaly and pulmonary  edema/infection.  She also has probable bilateral pleural effusions.  EKG had a rate of 128 and sinus tachycardia.  She is left axis deviated  at -64.  She has questionable inferior Qs, but no ST or T-wave changes.  EKG is of poor quality.  Labs show a white count of 19.8 with a left  shift.  Hemoglobin is 12.2.  Creatinine is 2.1, with a BUN of  32.  BNP  is elevated at 146.  Troponin is elevated at 0.43.  Her CK-MB is 9.   ASSESSMENT/PLAN:  This is a 70 year old white female with a history of  hyperlipidemia, hypertension, diabetes, chronic kidney disease, and  COPD; who presents with pneumonia and respiratory distress.  Her  respiratory distress appears to be mostly a combination of pneumonia and  COPD; but she may have some congestive heart failure aspect as well.  She has multiple risk factors for coronary artery disease, which would  lead to ischemic congestive heart failure.  Her troponin likely reflects  a type 2 myocardial infarction/demand ischemia.   RECOMMENDATIONS:  Are as follows:  1. Type 2 myocardial infarction:  Place her on a heparin drip for 48      hours.  Aspirin 325 mg daily.  Statin therapy.  Beta blocker if      tolerated (considering her wheezing, this may have to be forgiven      and replaced with nitroglycerin or ACE inhibitor).  Cycle her      cardiac enzymes x3.  Once she is stable she may be stressed as an      outpatient to look for occult coronary artery disease.  She tells      me that she had a stress test at some point in the past at [facility]; although I can find nothing on the system for this.  2. Edema:  Lasix p.r.n. to diurese, depending on her renal status.  I      would avoid over-diuresis at this point, as moderate diuresis will      help with her lung fluid, but over diuresis will cause compaction      of her pneumonia.  An echocardiogram will need to be checked for      left ventricular function, when she is feeling a bit better.  3. Hypertension:  Continue nitro transdermal -- ensure nitro free      period of 6 hours daily, so that the nitroglycerin does not lose      its effect.   Thank you very much for allowing me to see this patient, and we will  follow along with you.      Unice Cobble, MD  Electronically Signed  ACJ/MEDQ  D:  11/04/2008  T:  11/04/2008   Job:  [id number]

## 2010-10-30 NOTE — H&P (Signed)
[name], [name]:  [id number]   MEDICAL RECORD NO.:  [id number]          PATIENT TYPE:  INP   LOCATION:  0110                         FACILITY:  [facility]   PHYSICIAN:  Della Goo, M.D. DATE OF BIRTH:  Sep 14, 1940   DATE OF ADMISSION:  11/04/2008  DATE OF DISCHARGE:                              HISTORY & PHYSICAL   PRIMARY CARE PHYSICIAN:  [facility].   CHIEF COMPLAINT:  Shortness of breath.   HISTORY OF PRESENT ILLNESS:  This is a 70 year old female who was sent  to the emergency department from [facility] by ambulance  secondary to worsening and continued shortness of breath.  The patient  reports having cough, fever, chills over the past 5 days.  She was  worsening, went to her primary care physician's office and was sent  emergently to [facility] for further treatment and evaluation.  The patient does have a history of asthma.  When she arrived in the  emergency department, the patient was placed on BiPAP therapy secondary  to her respiratory distress.  She was also administered IV Solu-Medrol  en route to the emergency department.  The patient is unable to give a  history at this time secondary to being on BiPAP therapy.  She  desaturates when she is taken off of the BiPAP.  Her husband is also at  the bedside and is unable to give details of her medical history.   PAST MEDICAL HISTORY:  1. Asthma/COPD.  2. Renal insufficiency.  3. Hypertension.   MEDICATIONS:  Her medications include Advair, hydrochlorothiazide,  prednisone, and oxygen.   ALLERGIES:  FLAGYL.   PAST SURGICAL HISTORY:  Unable to obtain.   SOCIAL HISTORY:  The patient is married.  She is a nonsmoker,  nondrinker.   FAMILY HISTORY:  Unable to obtain.   REVIEW OF SYSTEMS:  Unable to obtain other than what is mentioned above  in the HPI.   PHYSICAL EXAMINATION:  GENERAL:  An elderly 70 year old, obese female in  acute distress.  VITAL SIGNS:  Temperature unable to obtain at this time secondary to  BiPAP therapy, blood pressure 187/91, heart rate 123, respirations 41,  O2 saturation 97% on BiPAP 60% FIO2.  HEENT:  Normocephalic, atraumatic.  Pupils reactive to light  bilaterally.  Extraocular movements are intact and  funduscopic benign.  Nares are patent bilaterally.  Oropharynx is clear.  Mucosa is dry.  NECK:  Neck is supple, full range of motion.  No thyromegaly, adenopathy  or jugular venous distention.  No thyromegaly.  CARDIOVASCULAR:  Tachycardiac rate and rhythm.  No murmurs, gallops or  rubs appreciated.  LUNGS:  Good aeration on BiPAP currently, clear.  No rales nor rhonchi.  ABDOMEN:  Distended, soft, nontender.  Positive bowel sounds are  present.  EXTREMITIES:  Without cyanosis, clubbing or edema.  NEUROLOGIC:  Generalized weakness but the patient is able to move all of  her extremities.  Her cranial nerves are intact.  There are no motor or  sensory deficits.   LABORATORY STUDIES:  White blood cell count 19.8, hemoglobin 10.9,  hematocrit 32.7, platelets  298,000, neutrophils 91%, lymphocytes 5%.  Sodium 133, potassium 3.6, chloride 96, bicarb 27, BUN 32, creatinine  2.7, glucose 70.  Chest x-ray reveals cardiomegaly and pulmonary edema,  plus or minus infection.  Beta natriuretic peptide 146.  Arterial blood  gas performed.  Initial arterial blood gas with a pH of 7.287, pCO2  60.3, pO2 86.8, bicarb 27.9 and O2 saturation 95.2% and this was on 6%  FIO2 initially.  Repeat blood gas performed after being on the BiPAP 60%  FIO2 revealed a pH of 7.37, pCO2 46.9.  The pO2 157 and the bicarb 26.5  with an O2 saturation 97.2% on an FIO2 of 60% as mentioned above.   ASSESSMENT:  This is a 70 year old female being admitted with:  1. Respiratory distress/acute respiratory failure on BiPAP therapy.  2. Pneumonia, asthma exacerbation.  3. Hypertension.  4. Chronic renal insufficiency/chronic kidney  disease.   PLAN:  The patient will be admitted to the intensive care unit.  She  will continue on BiPAP therapy.  IV antibiotic therapy of azithromycin  has already been administered and Avelox therapy has been ordered as  well.  The patient has been placed on IV steroid taper and nebulizer  treatments with albuterol and Atrovent have been ordered.  The patient  will be n.p.o. while she is on the BiPAP therapy and the BiPAP will be  titrated as her condition improves.  Cardiac enzymes will also be  performed and the patient will be placed on Nitrol paste, oxygen,  aspirin therapy along with Lovenox therapy for DVT and IV Protonix for  GI prophylaxis.  Further workup will ensue pending results of her  studies.      Della Goo, M.D.  Electronically Signed     HJ/MEDQ  D:  11/04/2008  T:  11/05/2008  Job:  [id number]

## 2010-10-30 NOTE — Discharge Summary (Signed)
[name], [name]            ACCOUNT NO.:  [id number]   MEDICAL RECORD NO.:  [id number]          PATIENT TYPE:  INP   LOCATION:  1416                         FACILITY:  [facility]   PHYSICIAN:  Marcellus Scott, MD     DATE OF BIRTH:  14-Sep-1940   DATE OF ADMISSION:  11/04/2008  DATE OF DISCHARGE:  11/22/2008                               DISCHARGE SUMMARY   ADDENDUM:  This is an addendum to the interim discharge summary that was  done by Dr. Marthann Schiller on November 15, 2008.  Please refer to that  also for details.   PRIMARY MEDICAL DOCTOR:  Dr. Rudi Heap   PRIMARY NEPHROLOGIST:  Dr. Elvis Coil   DISCHARGE DIAGNOSES:  1. Acute on chronic kidney disease.  2. Hypertension.  3. Oxygen-dependent chronic obstructive pulmonary disease.  4. Deconditioning.  5. Status post Non-ST-elevation Myocardial Infarction. Left      ventricular ejection fraction of 40% to 45%.  6. Accelerated junctional tachycardia, resolved.  7. Anemia.  8. Acute on chronic systolic and diastolic heart failure.  9. Community-acquired pneumonia, treated.  10.Escherichia coli urinary tract infection, treated.  11.Hypothyroid.  12.Hyperlipidemia.  13.Hyperkalemia, resolved.  14.Diabetes mellitus.   DISCHARGE MEDICATIONS:  1. Enteric-coated aspirin 325 mg p.o. daily.  2. Senokot S one p.o. at bedtime.  3. Lovaza 1 gram p.o. daily.  4. Zetia 10 mg p.o. daily.  5. Protonix 80 mg p.o. daily.  6. Synthroid 50 mcg p.o. daily.  7. Cymbalta 30 mg p.o. daily.  8. Zocor 40 mg p.o. at bedtime.  9. Hydralazine 25 mg p.o. t.i.d.  10.Albuterol 2.5 mg nebulizations inhaled q.6 h. scheduled and q.2 h.      p.r.n.  11.Budesonide 0.25 mg inhaled q.i.d.  12.Atrovent 0.5 mg nebulisations inhaled q.6 h.  13.Imdur 30 mg p.o. daily.  14.NovoLog sliding scale insulin t.i.d. a.c. as below:  For CBG less than 70, implement hypoglycemia protocol, 70-120 no  insulin, 121-150 one unit, 151-200 two units, 201-250 three units,  251-  300 five units, 301-350 seven units, 351-400 nine units, greater than  400 call MD.  1. MiraLax 17 grams in 8 ounces of liquid p.o. daily.  2. Aranesp 40 mcg subcutaneously weekly.  3. Plavix 75 mg p.o. daily.  4. Cardizem CD 120 mg p.o. daily.  Hold for heart rate less than 60.  5. Lasix 160 mg p.o. in the morning and 80 mg p.o. in the evening.  6. Ativan 0.5 mg p.o. t.i.d. p.r.n. for anxiety.  7. Tylenol 650 mg p.o. q.4 h. p.r.n. for mild to moderate pain or      fever.  8. Oxycodone IR 5 mg p.o. q.4 h. p.r.n. for moderate to severe pain.  9. Oxygen via nasal cannula at 3 to 4 L per minute continuously.   DISCONTINUED MEDICATIONS:  Loratadine, metolazone, Welchol, Nitro-Dur  patch, Mirapex, torsemide, Singulair, Advair, Norvasc, Nexium.   PROCEDURES:  1. Since November 15, 2008, is a chest x-ray on November 20, 2008.  Impression:      Improving bilateral pneumonia.  2. Chest x-ray on November 16, 2008, is partial clearing bilateral airspace  processes.   PERTINENT LABORATORIES:  Basic metabolic panel today with BUN 56,  creatinine 3.44.  BNP 54, hepatitis B surface antigen negative.  Coagulation indices within normal limits.  Hemoglobin A1c 6.1.  CBC with  hemoglobin 10.6, hematocrit 32, white blood cell 7.8, platelets 300.  Blood cultures from Nov 10, 2008:  No growth.   CONSULTATIONS:  1. Renal MD, Dr. Arlean Hopping  2. Cardiology, Dr. Daleen Squibb and Dr. Jens Som  3. Vascular surgery, Dr. Hart Rochester.   HOSPITAL COURSE AND PATIENT DISPOSITION:  [name] is a pleasant 70-  year-old Caucasian female patient with extensive past medical history  who had acute hypoxic respiratory failure secondary to COPD,  decompensated acute on chronic systolic and diastolic heart failure.  Pulmonary critical care had been consulted.  She was treated  appropriately with IV diuresis and antibiotics.  The patient initially  was treated in the ICU.  Once she was stabilized, she was transferred to  the medical  floor.  Nephrology and cardiology continued to follow her  course in the hospital.  On November 16, 2008, the patient was noted to have  markedly elevated creatinine.  The creatinine was in the 4.1 range.  The  patient was on IV diuretics for volume overload.  The thought process  was that the diuretics were contributing to the acute on chronic kidney  disease, and the patient needed the diuretics for the heart failure.  She had reached a point where the renal team called the vascular  surgeons for placement of a PermCath and for possible transfer to [facility] to start dialysis.  In the interim, cardiology saw the  patient for accelerated junctional tachycardia and indicated that the  findings on chest x-ray and clinical exam were not that of heart failure  but more of chronic lung disease.  By this time, the patient's  creatinine also started improving.  Her Lasix was reduced and changed to  p.o.  Since then, her creatinines have slowly been improving.  Nephrology indicates that from their standpoint, the patient can be  discharged.  I spoke with Dr. Elvis Coil yesterday, and he will make  arrangements for patient to be seen in his office in a week's time.  Vascular surgery was consulted, and vein mapping was done.  Again,  consider outpatient followup with him regarding placement of an AV  fistula for potential dialysis in the future.   The patient sustained a non-ST-elevation MI during this hospitalization.  The patient is to follow up with [facility] as an outpatient  for possible outpatient stress test.  Dr. Juanito Doom saw the patient today  and indicated that the patient is on a good medical regimen.  They would  not cath the patient.  They have cleared her for discharge  and will  arrange followup in their offices.  The patient has no chest pain.   Deconditioning.  The patient initially was quite weak and unable to  perform much activities and indicated she wanted  to go to the skilled  nursing facility.  However, with improving condition, she has been more  active and mobile and actually said she may be able to go home.  However, physical therapy evaluated and still recommend a short-term  skilled nursing facility prior to discharge home.  The patient is  agreeable to this plan and at this time is stable for discharge to the  nursing facility.   FOLLOWUPS:  1. Dr. Elvis Coil, Nephrology.  The nursing facility  is to arrange      for such followup.  2. [facility].  Follow up with either Dr. Jens Som or Dr.      Valera Castle.  The nursing facility to arrange for such followup.  3. Basic metabolic panel to be repeated twice weekly.  The next one is      to be in the next 3-4 days.  4. Vascular Surgery, Dr. Hart Rochester.  Follow-up appointment as an      outpatient.   Time taken in coordinating this discharge is 45 minutes.      Marcellus Scott, MD  Electronically Signed     AH/MEDQ  D:  11/22/2008  T:  11/22/2008  Job:  [id number]   cc:   Ernestina Penna, M.D.  Fax: [phone]   Garnetta Buddy, M.D.  Fax: [phone]   Jesse Sans. Wall, MD, FACC  [address]  [address]  [place]  [place] [address]   Quita Skye. Hart Rochester, M.D.  [address]  [place]  [place] [address]

## 2010-10-30 NOTE — Group Therapy Note (Signed)
NAMEBEVELY, HACKBART NO.:  [id number]   MEDICAL RECORD NO.:  [id number]          PATIENT TYPE:  INP   LOCATION:  1416                         FACILITY[facility]   PHYSICIAN:  Altha Harm, MDDATE OF BIRTH:  [birth year]                                 PROGRESS NOTE   PRIMARY CARE PHYSICIAN:  Ernestina Penna, MD; usually seen by PA Lindaann Pascal   NEPHROLOGIST:  Garnetta Buddy, M.D.   DISCHARGE DISPOSITION:  Likely home with home health physical therapy.   DISCHARGE DIAGNOSES:  1. Community-acquired pneumonia.  2. Acute on chronic diastolic dysfunction clinically compensated.  3. Non-ST-elevated myocardial infarction.  4. Escherichia coli urinary tract infection.  5. Acute on chronic renal failure.  6. Acute respiratory failure, resolved.  7. Hypothyroidism.  8. Hypertension.  9. Anemia of chronic disease.  10.Hyperlipidemia.  11.Chronic hypoxic respiratory failure.  12.History of chronic obstructive pulmonary disease.  13.History of constipation.  14.Oral candidiasis, resolved.  15.Colonic, ileus, resolved.   DISCHARGE MEDICATIONS:  1. Lovaza 1 gram p.o. q.i.d.  2. Mirapex 0.25 mg p.o. daily.  3. Levothyroxine 15 mcg p.o. daily.  4. Cymbalta 30 mg p.o. daily.  5. Zetia 10 mg p.o. daily.  6. Norvasc 5 mg p.o. daily.  7. Protonix 40 mg p.o. q.12 h.  8. Imdur 30 mg p.o. daily.  9. Plavix 75 mg p.o. daily.  10.Aranesp 40 mcg weekly.  11.Hydralazine 25 mg p.o. q.8 h.  12.Atrovent MDI 2 puffs p.o. q.6 h.  13.MiraLax 17 grams in 8 ounces of fluid daily.  14.Potassium chloride 20 mEq p.o. b.i.d.  15.Albuterol MDI 2 puffs p.o. q.4 h. p.r.n.  16.Oxygen 2-4 L around the clock.   CONSULTANTS:  1. Dr. Hyman Hopes and colleagues, nephrology  2. [facility]  3. Pulmonary critical care   PROCEDURES:  Swan-Ganz catheter placement.   DIAGNOSTIC STUDIES:  1. Chest x-ray done on admission on Nov 04, 2008, which showed      cardiomegaly and  pulmonary edema or infection.  2. Portable abdominal x-ray done on Nov 05, 2008, which showed an      ileus.  3. Portable chest x-ray done on Nov 06, 2008, which showed slight      improvement in pulmonary edema.  4. Repeat chest x-ray done on Nov 07, 2008, which showed no change in      chest x-ray.  5. Repeat x-ray done on Nov 08, 2008, which showed marked worsening of      bilateral airspace disease could be due to progressive pneumonia      and pulmonary edema.  6. Portable abdominal x-ray done on Nov 08, 2008, which showed mild      colonic ileus, unchanged.  7. Chest x-ray one view done on Nov 08, 2008, which showed no change      in bilateral airspace disease.  8. Portable chest x-ray done on Nov 10, 2008, which showed the tip of      the Swan-Ganz catheter in descending right interlobar artery.      Worsening bilateral airspace disease compatible with increased  edema or pneumonia.  9. Chest x-ray done on Nov 11, 2008, which showed status post removal      of Swan-Ganz.  10.CT of the chest without contrast done on Nov 11, 2008, which showed      mediastinal adenopathy, scattered bilateral pulmonary infiltrates      with consolidations as well.  11.Portable chest x-ray done on Nov 12, 2008, which showed improved      edema.  12.A 2-D echocardiogram done on Nov 05, 2008, which showed left      ventricle with approximately 40% to 45% ejection fraction with mild      distal inferior hypokinesis akinesis.  Apical akinesis, distal      septal hypokinesis, wall thickness increased in a pattern of mild      LVH.   CODE STATUS:  Full code.   ALLERGIES:  1. FLAGYL.  2. KEFLEX.   CHIEF COMPLAINT:  Shortness of breath.   HISTORY OF PRESENT ILLNESS:  Please refer to the H and P by Dr. Lovell Sheehan for details of the HPI.   HOSPITAL COURSE:  1. Acute hypoxic respiratory failure.  The patient's respiratory      failure was felt to be multifactorial owing to COPD, non-ST-       elevated myocardial infarction, pneumonia, decompensated diastolic      dysfunction and systolic dysfunction.  The patient was treated with      noninvasive positive pressure ventilation.  The patient had waxing      and waning of her respiratory function.  Pulmonary critical care      was consulted, and a Swan-Ganz catheter was placed for evaluation      of intravascular volume.  This led to the patient receiving      significant diuresis which improved her respiratory problem.  The      patient was also treated with Avelox for her pneumonia and has      completed a full course of treatment for the pneumonia.  In      addition, the patient was treated with her respiratory medications,      and she is now at her baseline of 2-4 L of oxygen dependent upon      her activity.  2. Non-ST elevated MI.  The patient had elevation of her troponins      without elevation of her ST segment.  Cardiology was consulted.      They saw the patient in consult, and there was much discussion      about the need to perform a cardiac catheterization on the patient.      However, the patient had worsening renal function and was not a      candidate for cardiac catheterization right at that time.  It was      felt that it was in the patient's best interest to treat her      conservatively and medically rather than to perform a diagnostic      cardiac cath without benefit of immediate treatment.  The patient      is being treated conservatively with medical treatment with      aspirin, Plavix and statins.  The patient is to follow up in 2-4      weeks after discharge for a stress Myoview at that time.  3. Acute on chronic renal failure.  The patient is known to have a      baseline chronic kidney disease, stage III.  However, during her  hospitalization, her kidney failure worsened considerably.  The      patient has been seen by nephrology.  Her creatinine today is at      3.75 with a BUN of 48.  She is  being evaluated by vein and vascular      surgery for vein mapping and possible placement of graft in      anticipation of dialysis in the not too distant future.  The      nephrologists are still currently managing her fluid status and her      diuretics.  4. E. coli UTI.  The patient was found to have E. coli UTI that was      sensitive only to nitrofurantoin and to cephalosporins.  The      patient was contraindicated in the use of nitrofurantoin due to her      renal failure.  The patient, however, does have an allergy listed      to Keflex, and she was started on ceftriaxone and monitored closely      for any adverse reactions.  The patient tolerated the ceftriaxone      without adverse reaction and will complete her antibiotics      tomorrow.  5. Anemia.  The patient has normocytic normochromic anemia without      blood loss.  It is felt this is likely due to multiple phlebotomies      in addition to her renal disease.  The patient was transfused 1      unit of packed red blood cells, particularly in light of her recent      non-ST-elevated MI, and her hemoglobin as of today was 10 with      hematocrit of 30.  6. Oral candidiasis.  The patient was treated with Diflucan and has      completed her treatment today.  7. Hypothyroidism.  The patient has continued her usual medication.  8. Hypertension.  The patient has a history of hypertension.  However,      she was hypotensive during her hospitalization.  She has been      resumed on her usual medications, and blood pressures have been      stable.   This brings the patient's hospital course up to date.  In terms of  disposition, there was some discussion for the patient going to a  skilled facility for rehab.  However, the patient has improved  considerably in her function and will likely be discharged home with  home health therapies.  The patient was also evaluated by speech therapy  for dysphagia and was not found to have any  appreciable dysphagia.  It  was felt that her reflux was actually contributing more to the patient's  symptoms than anything else.  The patient is on a regular disease-  modified diet.      Altha Harm, MD  Electronically Signed     MAM/MEDQ  D:  11/15/2008  T:  11/15/2008  Job:  [id number]

## 2010-12-13 ENCOUNTER — Ambulatory Visit (INDEPENDENT_AMBULATORY_CARE_PROVIDER_SITE_OTHER): Payer: Medicare Other | Admitting: Cardiology

## 2010-12-13 ENCOUNTER — Encounter: Payer: Self-pay | Admitting: Cardiology

## 2010-12-13 DIAGNOSIS — E785 Hyperlipidemia, unspecified: Secondary | ICD-10-CM

## 2010-12-13 DIAGNOSIS — I509 Heart failure, unspecified: Secondary | ICD-10-CM

## 2010-12-13 DIAGNOSIS — I10 Essential (primary) hypertension: Secondary | ICD-10-CM

## 2010-12-13 DIAGNOSIS — I5032 Chronic diastolic (congestive) heart failure: Secondary | ICD-10-CM

## 2010-12-13 DIAGNOSIS — I2510 Atherosclerotic heart disease of native coronary artery without angina pectoris: Secondary | ICD-10-CM

## 2010-12-13 DIAGNOSIS — N259 Disorder resulting from impaired renal tubular function, unspecified: Secondary | ICD-10-CM

## 2010-12-13 DIAGNOSIS — R0602 Shortness of breath: Secondary | ICD-10-CM

## 2010-12-13 DIAGNOSIS — I5040 Unspecified combined systolic (congestive) and diastolic (congestive) heart failure: Secondary | ICD-10-CM

## 2010-12-13 NOTE — Assessment & Plan Note (Signed)
Followed by nephrology. 

## 2010-12-13 NOTE — Progress Notes (Signed)
HPI: Pleasant female with a history of COPD, diastolic congestive heart failure and renal insufficiency for f/u. She had a non-ST elevation myocardial infarction previously in the setting of sepsis. An echocardiogram was performed on Nov 05, 2008 and it showed an ejection fraction of 40-45% with wall motion abnormalities. Given renal insufficiency it was felt that medical therapy was indicated. Myoview on January 25, 2009 showed no ischemia or infarction and her ejection fraction was 73%. I last saw her in June of 2011. Since then she has dyspnea with activities. There is no PND or pedal edema. Her dyspnea is relieved with rest. There is no associated chest pain. She does not have exertional chest pain, palpitations or syncope.  Current Outpatient Prescriptions  Medication Sig Dispense Refill  . albuterol (VENTOLIN HFA) 108 (90 BASE) MCG/ACT inhaler Inhale 2 puffs into the lungs every 6 (six) hours as needed.        Marland Kitchen aspirin 325 MG EC tablet Take 325 mg by mouth daily.        Marland Kitchen diltiazem (CARDIZEM CD) 120 MG 24 hr capsule Take 120 mg by mouth daily.        . divalproex (DEPAKOTE) 250 MG EC tablet Take 500 mg by mouth at bedtime.       Marland Kitchen ezetimibe (ZETIA) 10 MG tablet Take 10 mg by mouth daily.        . hydrALAZINE (APRESOLINE) 25 MG tablet Take 25 mg by mouth 3 (three) times daily.        . isosorbide mononitrate (IMDUR) 30 MG 24 hr tablet Take 30 mg by mouth daily.        Marland Kitchen levothyroxine (SYNTHROID, LEVOTHROID) 88 MCG tablet Take 88 mcg by mouth daily.        . Multiple Vitamins-Calcium (ONE-A-DAY WOMENS FORMULA PO) Take 1 tablet by mouth daily.        Marland Kitchen omega-3 acid ethyl esters (LOVAZA) 1 G capsule Take 2 g by mouth daily.        . pantoprazole (PROTONIX) 40 MG tablet Take 40 mg by mouth daily.        . polyethylene glycol powder (MIRALAX) powder Take 17 g by mouth daily.        . Red Yeast Rice 600 MG CAPS Take 1 capsule by mouth daily.        Marland Kitchen senna-docusate (SENOKOT-S) 8.6-50 MG per tablet  Take 1 tablet by mouth daily.           Past Medical History  Diagnosis Date  . Osteoarthritis   . Pelvic inflammatory disease   . 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 MI (myocardial infarction)   . HTN (hypertension)   . Hyperlipidemia   . Hypothyroidism   . Anemia   . Lung nodule   . COPD (chronic obstructive pulmonary disease)   . Renal insufficiency   . DM (diabetes mellitus)     Past Surgical History  Procedure Date  . Back surgery   . Tubal ligation   . Mandible fracture surgery     History   Social History  . Marital Status: Married    Spouse Name: N/A    Number of Children: N/A  . Years of Education: N/A   Occupational History  . Not on file.   Social History Main Topics  . Smoking status: Former Games developer  . Smokeless tobacco: Not on file  . Alcohol Use: Not on file  . Drug Use: Not on file  .  Sexually Active: Not on file   Other Topics Concern  . Not on file   Social History Narrative  . No narrative on file    ROS: no fevers or chills, productive cough, hemoptysis, dysphasia, odynophagia, melena, hematochezia, dysuria, hematuria, rash, seizure activity, orthopnea, PND, pedal edema, claudication. Remaining systems are negative.  Physical Exam: Well-developed well-nourished in no acute distress.  Skin is warm and dry.  HEENT is normal.  Neck is supple. No thyromegaly.  Chest is clear to auscultation with normal expansion.  Cardiovascular exam is regular rate and rhythm.  Abdominal exam nontender or distended. No masses palpated. Extremities show no edema. neuro grossly intact  ECG Sinus rhythm at a rate of 77. Left anterior fascicular block. No ST changes.

## 2010-12-13 NOTE — Assessment & Plan Note (Signed)
Felt secondary to COPD.

## 2010-12-13 NOTE — Assessment & Plan Note (Signed)
Blood pressure controlled. Continue present medications. 

## 2010-12-13 NOTE — Patient Instructions (Signed)
Your physician wants you to follow-up in: ONE YEAR WITH DR Shelda Pal will receive a reminder letter in the mail two months in advance. If you don't receive a letter, please call our office to schedule the follow-up appointment.   Your physician recommends that you return for lab work in: WITH DIALYSIS

## 2010-12-13 NOTE — Assessment & Plan Note (Signed)
Continue statin. Check lipids and liver. 

## 2010-12-13 NOTE — Assessment & Plan Note (Signed)
Patient with history of diastolic dysfunction. Euvolemic on examination. Continue present medications.

## 2010-12-13 NOTE — Assessment & Plan Note (Signed)
Presumed secondary to previous MI. However myoview negative. Continue medical therapy including aspirin and statin. We have avoided catheterization secondary to renal insufficiency. Patient also with severe o2 dependent COPD.  

## 2010-12-24 ENCOUNTER — Encounter: Payer: Self-pay | Admitting: Cardiology

## 2011-01-02 ENCOUNTER — Emergency Department (HOSPITAL_COMMUNITY): Payer: Medicare Other

## 2011-01-02 ENCOUNTER — Encounter (HOSPITAL_COMMUNITY): Payer: Self-pay | Admitting: Radiology

## 2011-01-02 ENCOUNTER — Observation Stay (HOSPITAL_COMMUNITY)
Admission: EM | Admit: 2011-01-02 | Discharge: 2011-01-03 | Disposition: A | Discharge status: Home / Self Care | Payer: Medicare Other | Attending: Internal Medicine | Admitting: Internal Medicine

## 2011-01-02 DIAGNOSIS — R21 Rash and other nonspecific skin eruption: Secondary | ICD-10-CM | POA: Insufficient documentation

## 2011-01-02 DIAGNOSIS — I129 Hypertensive chronic kidney disease with stage 1 through stage 4 chronic kidney disease, or unspecified chronic kidney disease: Secondary | ICD-10-CM | POA: Insufficient documentation

## 2011-01-02 DIAGNOSIS — J4489 Other specified chronic obstructive pulmonary disease: Secondary | ICD-10-CM | POA: Insufficient documentation

## 2011-01-02 DIAGNOSIS — E039 Hypothyroidism, unspecified: Secondary | ICD-10-CM | POA: Insufficient documentation

## 2011-01-02 DIAGNOSIS — E785 Hyperlipidemia, unspecified: Secondary | ICD-10-CM | POA: Insufficient documentation

## 2011-01-02 DIAGNOSIS — I4460 Unspecified fascicular block: Secondary | ICD-10-CM | POA: Insufficient documentation

## 2011-01-02 DIAGNOSIS — R079 Chest pain, unspecified: Secondary | ICD-10-CM

## 2011-01-02 DIAGNOSIS — R0989 Other specified symptoms and signs involving the circulatory and respiratory systems: Secondary | ICD-10-CM | POA: Insufficient documentation

## 2011-01-02 DIAGNOSIS — M542 Cervicalgia: Secondary | ICD-10-CM

## 2011-01-02 DIAGNOSIS — R0609 Other forms of dyspnea: Secondary | ICD-10-CM | POA: Insufficient documentation

## 2011-01-02 DIAGNOSIS — R0789 Other chest pain: Principal | ICD-10-CM | POA: Insufficient documentation

## 2011-01-02 DIAGNOSIS — J449 Chronic obstructive pulmonary disease, unspecified: Secondary | ICD-10-CM | POA: Insufficient documentation

## 2011-01-02 DIAGNOSIS — Z79899 Other long term (current) drug therapy: Secondary | ICD-10-CM | POA: Insufficient documentation

## 2011-01-02 DIAGNOSIS — M25519 Pain in unspecified shoulder: Secondary | ICD-10-CM | POA: Insufficient documentation

## 2011-01-02 DIAGNOSIS — N184 Chronic kidney disease, stage 4 (severe): Secondary | ICD-10-CM | POA: Insufficient documentation

## 2011-01-02 LAB — APTT: aPTT: 27 seconds (ref 24–37)

## 2011-01-02 LAB — DIFFERENTIAL
Lymphocytes Relative: 26 % (ref 12–46)
Monocytes Absolute: 0.7 10*3/uL (ref 0.1–1.0)
Monocytes Relative: 8 % (ref 3–12)
Neutro Abs: 5.4 10*3/uL (ref 1.7–7.7)
Neutrophils Relative %: 62 % (ref 43–77)

## 2011-01-02 LAB — CARDIAC PANEL(CRET KIN+CKTOT+MB+TROPI)
Relative Index: 1 (ref 0.0–2.5)
Total CK: 283 U/L — ABNORMAL HIGH (ref 7–177)
Troponin I: <0.3 ng/mL (ref <0.30)

## 2011-01-02 LAB — CBC
HCT: 41.6 % (ref 36.0–46.0)
Hemoglobin: 13.3 g/dL (ref 12.0–15.0)
MCH: 32.2 pg (ref 26.0–34.0)
MCHC: 32 g/dL (ref 30.0–36.0)
RBC: 4.13 MIL/uL (ref 3.87–5.11)

## 2011-01-02 LAB — LIPASE, BLOOD: Lipase: 36 U/L (ref 11–59)

## 2011-01-02 LAB — BASIC METABOLIC PANEL
BUN: 30 mg/dL — ABNORMAL HIGH (ref 6–23)
CO2: 26 mEq/L (ref 19–32)
Calcium: 11.3 mg/dL — ABNORMAL HIGH (ref 8.4–10.5)
GFR calc non Af Amer: 22 mL/min — ABNORMAL LOW (ref >60)
Glucose, Bld: 97 mg/dL (ref 70–99)
Potassium: 4.8 mEq/L (ref 3.5–5.1)

## 2011-01-02 LAB — HEPATIC FUNCTION PANEL
AST: 18 U/L (ref 0–37)
Albumin: 3.3 g/dL — ABNORMAL LOW (ref 3.5–5.2)
Total Bilirubin: 0.2 mg/dL — ABNORMAL LOW (ref 0.3–1.2)
Total Protein: 7.2 g/dL (ref 6.0–8.3)

## 2011-01-02 LAB — CK TOTAL AND CKMB (NOT AT ARMC)
CK, MB: 1.6 ng/mL (ref 0.3–4.0)
Relative Index: INVALID (ref 0.0–2.5)

## 2011-01-02 MED ORDER — TECHNETIUM TO 99M ALBUMIN AGGREGATED
5.2000 | Freq: Once | INTRAVENOUS | Status: AC | PRN
  Administered 2011-01-02: 5.2 via INTRAVENOUS

## 2011-01-02 MED ORDER — XENON XE 133 GAS
7.7000 | GAS_FOR_INHALATION | Freq: Once | RESPIRATORY_TRACT | Status: AC | PRN
  Administered 2011-01-02: 7.7 via RESPIRATORY_TRACT

## 2011-01-02 NOTE — H&P (Unsigned)
[name], [name]            ACCOUNT NO.:  [id number]  MEDICAL RECORD NO.:  [id number]  LOCATION:  WLED                         FACILITY:  [facility]  PHYSICIAN:  Hartley Barefoot, MD    DATE OF BIRTH:  12/28/1940  DATE OF ADMISSION:  01/02/2011 DATE OF DISCHARGE:                             HISTORY & PHYSICAL   CHIEF COMPLAINT:  Right-sided shoulder pain and chest pain.  BRIEF HISTORY OF PRESENT ILLNESS:  This is a 70 year old with past medical history of chronic kidney disease, COPD on home oxygen, bipolar disorder, coronary artery disease, who presents to the emergency department complaining of right-sided shoulder pain.  Shoulder pain started 2 days prior to admission.  She also relates chest pain that started overnight the night prior to admission.  She feels like indigestion.  Burning sensation and intermittent pain.  Rates from 1-10. She feels that chest pain is better.  She has some mild shortness of breath.  She relates some nausea on and off all day.  Denies vomiting. She related that shoulder pain is worse with movement.  No vomiting or diarrhea.  ALLERGIES: 1. FLAGYL, hives. 2. CEPHALEXIN. 3. CIPRO. 4. ALLOPURINOL. 5. AMOXICILLIN, itching.  PAST MEDICAL HISTORY: 1. Chronic bipolar. 2. COPD, on home oxygen. 3. Chronic kidney disease. 4. History of carotid artery stenosis. 5. Hyperlipidemia. 6. Hypertension. 7. 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  HOME MEDICATIONS: 1. Depakote 250 mg p.o. daily at bedtime. 2. Hydralazine 25 mg p.o. t.i.d. 3. Zetia 10 mg p.o. daily. 4. Imdur 30 mg every morning. 5. Cardizem 120 mg p.o. daily. 6. Women's daily vitamins. 7. Tylenol 325 every 6 hours as needed. 8. MiraLax 17 grams p.o. daily. 9. Crestor 20 mg p.o. daily. 10.Fish oil p.o. daily. 11.Iron 65 mg every morning. 12.Protonix 40 mg p.o. daily. 13.Levoxyl 88 mcg p.o. every morning. 14.Stool softener 2 tablets daily at  bedtime. 15.Aspirin 325 p.o. daily.  REVIEW OF SYSTEMS:  Negative except as per HPI.  SOCIAL HISTORY:  She is married, nonsmoker, no drinking, and no recreational drugs.  She lives with her husband.  PHYSICAL EXAMINATION:  VITAL SIGNS:  Blood pressure 186/78, sats 98 on 2 L, respirations 20, pulse 88, and temperature 98.3. GENERAL:  The patient lying in bed in no acute distress, speaking in full sentences.  Not using accessory muscles to breathe. HEENT:  Head:  Atraumatic and normocephalic.  Eyes:  Anicteric.  Pupils equal and reactive to light.  Extraocular muscles intact. CARDIOVASCULAR:  S1-S2.  Regular rhythm and rate.  No rubs, murmurs, or gallops. LUNGS:  Bilateral air movement.  No wheezing, mild crackles, no rhonchi. ABDOMEN:  Bowel sounds positive, soft, nontender, and nondistended.  No rigidity. EXTREMITIES:  No edema. NEUROLOGIC:  Nonfocal. MUSCULOSKELETAL:  Pain on right shoulder blade.  Pain with movement. Motor strength 5/5, bilateral upper and lower extremities.  ADMISSION LABORATORY DATA:  White blood cells 8.8, hemoglobin 13, hematocrit 41, and platelet 194.  Sodium 139, potassium 4.8, chloride 101, bicarb 26, BUN 30, creatinine 2.1, glucose 97, and calcium 11.3. V/Q scan:  Low to intermediate probability for PE.  Bilirubin 0.2. Troponin 0.30.  AST 18, AST 48, and albumin 3.3.  ASSESSMENT AND PLAN:  1. Chest pain, rule out acute coronary syndrome.  This patient sounds     like gastrointestinal in origin.  Cardiac enzymes x3.  Protonix     changed to b.i.d.  The patient also complaining of mild shortness     of breath.  I will check a BNP.  We will continue with aspirin,     morphine p.r.n., and nitroglycerin p.r.n. 2. Right-sided shoulder pain, likely musculoskeletal pain.  We will     continue Vicodin p.r.n. for pain.  If pain persists, she will need     a further evaluation with imaging. 3. Chronic kidney disease, unclear what this patient's creatinine      baseline is.  In reviewing records from February 2011, her     creatinine was 1.9 and BUN 30.  Today, BUN is 30 and creatinine 2.1     and this may be acute on chronic.  I will hold on IV fluids due to     mild shortness of breath and congestive heart failure.  We will     repeat renal function.  We will ask Dr. Marland Mcalpine office for records.     Depending on records, we will consider renal ultrasound if     appropriate. 4. Hypertension.  We will restart home blood pressure medications. 5. Deep venous thrombosis prophylaxis, sequential compression devices.    Hartley Barefoot, MD    BR/MEDQ  D:  01/02/2011  T:  01/02/2011  Job:  [id number]

## 2011-01-03 LAB — TSH: TSH: 1.063 u[IU]/mL (ref 0.350–4.500)

## 2011-01-03 LAB — CBC
HCT: 40.6 % (ref 36.0–46.0)
Hemoglobin: 12.8 g/dL (ref 12.0–15.0)
WBC: 12.5 10*3/uL — ABNORMAL HIGH (ref 4.0–10.5)

## 2011-01-03 LAB — CARDIAC PANEL(CRET KIN+CKTOT+MB+TROPI)
CK, MB: 2.9 ng/mL (ref 0.3–4.0)
Relative Index: 0.7 (ref 0.0–2.5)
Troponin I: <0.3 ng/mL (ref <0.30)

## 2011-01-03 LAB — BASIC METABOLIC PANEL
BUN: 30 mg/dL — ABNORMAL HIGH (ref 6–23)
Chloride: 100 mEq/L (ref 96–112)
GFR calc Af Amer: 27 mL/min — ABNORMAL LOW (ref >60)
GFR calc non Af Amer: 22 mL/min — ABNORMAL LOW (ref >60)
Glucose, Bld: 94 mg/dL (ref 70–99)
Potassium: 4.5 mEq/L (ref 3.5–5.1)
Sodium: 135 mEq/L (ref 135–145)

## 2011-01-03 NOTE — Discharge Summary (Signed)
NAMEPRESTON, WEILL            ACCOUNT NO.:  [id number]  MEDICAL RECORD NO.:  [id number]  LOCATION:  1404                         FACILITY:  [facility]  PHYSICIAN:  Veverly Fells. Excell Seltzer, MD  DATE OF BIRTH:  08-20-1940  DATE OF ADMISSION:  01/02/2011 DATE OF DISCHARGE:                              DISCHARGE SUMMARY   REQUESTING PHYSICIAN:  April Palumbo-Rasch, MD  PRIMARY PHYSICIAN:  Dr. Merri Ray, [facility] in [place].  CHIEF COMPLAINT:  Right chest, shoulder, and neck pain.  HISTORY OF PRESENT ILLNESS:  Ms. [name] is a 70 year old woman who presented to the [facility] Emergency Department with complaint of 2-3 days of right shoulder followed by right chest and right neck pain.  Her neck pain became worse today and this prompted her to come to the emergency department for further evaluation.  She describes the pain as a constant ache.  There is no pleuritic component.  She does note worsening of her pain with range of motion of the right shoulder.  She also reports tenderness with palpation over the right neck area.  She has had no fever, chills, or cough.  She has chronic dyspnea and this has been a little bit worse over the past few days.  She denies orthopnea, PND, or edema. She has had no diaphoresis or syncope.  She does report nausea and lightheadedness.  Her past cardiac history is pertinent for non-ST elevation infarction in 2010.  This was thought to be related to demand ischemia.  Her ejection fraction at that time was estimated at 40% to 45%.  She did not undergo cardiac catheterization because of chronic kidney disease.  She had a followup echo in January 2011 that showed normal LV function with an ejection fraction of 55%.  She also had a followup Myoview stress study that showed no ischemia.  The patient has never undergone cardiac catheterization.  PAST MEDICAL HISTORY: 1. Chronic kidney disease, stage 4.  Her creatinine clearance today is  estimated at 22 mL per minute which is about her baseline.  She is     followed by Dr. Hyman Hopes at [facility]. 2. COPD.  The patient wears home oxygen on occasion. 3. Hypertension. 4. Dyslipidemia. 5. Bipolar disorder. 6. Hypothyroidism.  FAMILY HISTORY:  Negative for premature coronary artery disease.  SOCIAL HISTORY:  The patient lives with her husband.  She does not smoke cigarettes or drink alcohol.  She does ambulate with a walker.  HOME MEDICATIONS:  Albuterol, Apresoline, aspirin, Cardizem, Crestor, Depakote, Imdur, MiraLax, multivitamin, Protonix, Synthroid, Zetia, red yeast rice, Lovaza, and Senokot.  ALLERGIES: 1. ALLOPURINOL. 2. CIPRO. 3. FLAGYL.  REVIEW OF SYSTEMS:  Negative except as per HPI.  PHYSICAL EXAMINATION:  GENERAL:  The patient is alert and oriented.  She is an elderly appearing woman, in no acute distress. VITAL SIGNS:  Temperature is 98.3, heart rate is 85, blood pressure is 156/91, respiratory rate is 20. HEENT:  Normal. NECK:  Normal carotid upstrokes.  No bruits.  No thyromegaly or thyroid nodules.  JVP is normal.  There is point tenderness over the right shoulder and right trapezius. LUNGS:  Diminished breath sounds bilaterally with fair air movement. There are no rhonchi present.  The apex is nonpalpable. HEART:  Regular rate and rhythm without murmur or gallop. ABDOMEN:  Soft, obese, nontender, no organomegaly. BACK:  No CVA tenderness. EXTREMITIES:  There is no clubbing, cyanosis, or edema.  Peripheral pulses are intact and equal. SKIN:  Warm and dry without rash. NEUROLOGIC:  Cranial nerves II through XII are intact.  Strength is intact and equal.  EKG shows sinus rhythm with left anterior fascicular block and pulmonary disease pattern, ventricular rate is 92 beats per minute, there are no significant ST or T wave changes.  The EKG is unchanged from a previous tracing dated, July 22, 2009.  Chest x-ray shows no acute  disease.  Nuclear medicine V/Q scan shows no segmental defects, but there is a patchy radiotracer distribution bilaterally, left greater than right. This was interpreted as low to intermediate probability for pulmonary embolus.  LABORATORY DATA:  White blood cell count is 8.8, hemoglobin is 13.3, platelet count is 194,000, 62% neutrophils, 26% lymphocytes, 8% monocytes. Metabolic panel shows a creatinine of 2.2, BUN 30, potassium 4.8.  CK-MB is 42 and 1.6.  Troponin is less than 0.3.  Liver function tests are normal.  Albumin is 3.3.  FINAL ASSESSMENT:  This is a 70 year old woman with atypical chest pain. Her pain is highly atypical for ischemic cardiac disease.  She has had greater than 2 days of constant pain and has normal cardiac markers, I think this is very unlikely to represent acute coronary syndrome.  I do not think any further cardiac evaluation is needed at this time.  Her clinical presentation appears most consistent with musculoskeletal pain. I am not sure what to make of her low to intermediate probability V/Q scan, but overall I think her clinical presentation is also unlikely to represent acute pulmonary embolus.  Recommend that the patient continue regular cardiac followup with Dr. Jens Som.  Her maintenance medications were reviewed and she is on appropriate risk reduction medication with aspirin and Crestor.     Veverly Fells. Excell Seltzer, MD     MDC/MEDQ  D:  01/02/2011  T:  01/02/2011  Job:  [id number]  cc:   Garnetta Buddy, M.D. Fax: [phone]  Madolyn Frieze. Jens Som, MD, [facility] [address]  [address] [place] [address]  Electronically Signed by Tonny Bollman MD on 01/03/2011 12:48:26 AM

## 2011-01-04 NOTE — Discharge Summary (Unsigned)
[name], Weeks            ACCOUNT NO.:  [id number]  MEDICAL RECORD NO.:  [id number]  LOCATION:  1404                         FACILITY:  [facility]  PHYSICIAN:  Hartley Barefoot, MD    DATE OF BIRTH:  [birth year]  DATE OF ADMISSION:  01/02/2011 DATE OF DISCHARGE:  01/03/2011                              DISCHARGE SUMMARY   DISCHARGE DIAGNOSES: 1. Chest pain, probably secondary to esophagitis. 2. Shoulder pain, musculoskeletal pain. 3. Rash dermatitis versus some mild cellulitis. 4. Hypertension. 5. Coronary artery disease. 6. Chronic kidney disease stage IV. 7. Chronic obstructive pulmonary disease on chronic oxygen.  DISCHARGE MEDICATIONS: 1. Bactrim 1 tablet p.o. b.i.d. 2. Hydrocodone 5/325 every 6 hours as needed. 3. Crestor 20 mg half a tablet by mouth daily. 4. Protonix 40 mg b.i.d. 5. Cardizem 120 p.o. daily. 6. Depakote 250 mg p.o. daily at bedtime. 7. Aspirin 325 p.o. daily every morning. 8. Fish oil 1000 mg every morning. 9. Hydralazine 25 mg three times a day. 10.Imdur 30 mg 1 tablet every morning. 11.Iron 65 two tablet every morning. 12.Levoxyl 88 mcg p.o. every morning. 13.MiraLAX one scoop by mouth daily. 14.__________ 1 tablet daily. 15.Stool Softener two tablets daily at bedtime. 16.Tylenol 325 every 6 hours as needed. 17.Women's daily multivitamins p.o. daily. 18.Zetia 10 mg p.o. daily. 19.Albuterol 90 mcg q.6 hours p.r.n.  DISPOSITION AND FOLLOWUP:  [name] will need to follow with her primary care physician within a week to follow her rash.  She will need also to follow with her nephrologist and with her cardiologist for her chronic kidney disease and for coronary artery disease and for her history of heart failure.  RADIOGRAPHIC STUDY: 1. V/Q scan low to intermediate probability for PE. 2. Chest x-ray no acute AFib.  ASSESSMENT AND PLAN: 1. Chest pain likely secondary to esophagitis.  The patient was     started on Protonix, the pain has  improved.  Cardiac enzymes x3     negative.  Also questionable shoulder pain radiating to her chest.     The patient will be discharged on increased doses of Protonix.  If     she tolerates food today, she will be discharged today. 2. Chronic kidney disease stage IV.  This appeared that this is the     patient's baseline.  I called Dr. Hyman Hopes office and her last     creatinine was 2.3-2.0 was in March.  The patient will need to     follow up with Dr. Hyman Hopes.  She is having good urine output.  Her     renal function is stable. 3. Chronic obstructive pulmonary disease on chronic oxygen.  The     patient appeared to be dyspneic at baseline at this time.  We will     continue with her with her, and we will discharge her on albuterol     as needed. 4. Systolic heart failure.  The patient had mild elevated BNP, but she     does not look fluid overload.  No edema on physical exam.  The     patient will need to follow with her nephrologist and cardiologist     for further recommendations.  She is  stable at this time. 5. The rash on her back.  She has some erythematous plaque.  This     could be dermatitis.  I will cover with Bactrim for any     superimposed infection.  She will need to follow up with her     primary care physician.  On the day of discharge, the patient was in improved condition.  Blood pressure 150/77, sat 98 on 2 liter, respirations 27, temperature 97.7, pulse 80, creatinine 2.0, white blood cell 12, hemoglobin 12.     Hartley Barefoot, MD     BR/MEDQ  D:  01/03/2011  T:  01/04/2011  Job:  [id number]

## 2011-01-10 NOTE — H&P (Signed)
NAMEELISAVET, [name]            ACCOUNT NO.:  [id number]  MEDICAL RECORD NO.:  [id number]  LOCATION:  1404                         FACILITY:  [facility]  PHYSICIAN:  Hartley Barefoot, MD    DATE OF BIRTH:  10/18/1940  DATE OF ADMISSION:  01/02/2011 DATE OF DISCHARGE:  01/03/2011                             HISTORY & PHYSICAL   CHIEF COMPLAINT:  Right-sided shoulder pain and chest pain.  BRIEF HISTORY OF PRESENT ILLNESS:  This is a 71 year old with past medical history of chronic kidney disease, COPD on home oxygen, bipolar disorder, coronary artery disease, who presents to the emergency department complaining of right-sided shoulder pain.  Shoulder pain started 2 days prior to admission.  She also relates chest pain that started overnight the night prior to admission.  She feels like indigestion.  Burning sensation and intermittent pain.  Rates from 1-10. She feels that chest pain is better.  She has some mild shortness ofbreath.  She relates some nausea on and off all day.  Denies vomiting. She related that shoulder pain is worse with movement.  No vomiting or diarrhea.  ALLERGIES: 1. FLAGYL, hives. 2. CEPHALEXIN. 3. CIPRO. 4. ALLOPURINOL. 5. AMOXICILLIN, itching.  PAST MEDICAL HISTORY: 1. Chronic bipolar. 2. COPD, on home oxygen. 3. Chronic kidney disease. 4. History of carotid artery stenosis. 5. Hyperlipidemia. 6. Hypertension. 7. 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  HOME MEDICATIONS: 1. Depakote 250 mg p.o. daily at bedtime. 2. Hydralazine 25 mg p.o. t.i.d. 3. Zetia 10 mg p.o. daily. 4. Imdur 30 mg every morning. 5. Cardizem 120 mg p.o. daily. 6. Women's daily vitamins. 7. Tylenol 325 every 6 hours as needed. 8. MiraLax 17 grams p.o. daily. 9. Crestor 20 mg p.o. daily. 10.Fish oil p.o. daily. 11.Iron 65 mg every morning. 12.Protonix 40 mg p.o. daily. 13.Levoxyl 88 mcg p.o. every morning. 14.Stool softener 2 tablets daily at  bedtime. 15.Aspirin 325 p.o. daily.  REVIEW OF SYSTEMS:  Negative except as per HPI.  SOCIAL HISTORY:  She is married, nonsmoker, no drinking, and no recreational drugs.  She lives with her husband.  PHYSICAL EXAMINATION:  VITAL SIGNS:  Blood pressure 186/78, sats 98 on 2 L, respirations 20, pulse 88, and temperature 98.3. GENERAL:  The patient lying in bed in no acute distress, speaking in full sentences.  Not using accessory muscles to breathe. HEENT:  Head:  Atraumatic and normocephalic.  Eyes:  Anicteric.  Pupils equal and reactive to light.  Extraocular muscles intact. CARDIOVASCULAR:  S1-S2.  Regular rhythm and rate.  No rubs, murmurs, or gallops. LUNGS:  Bilateral air movement.  No wheezing, mild crackles, no rhonchi. ABDOMEN:  Bowel sounds positive, soft, nontender, and nondistended.  No rigidity. EXTREMITIES:  No edema. NEUROLOGIC:  Nonfocal. MUSCULOSKELETAL:  Pain on right shoulder blade.  Pain with movement. Motor strength 5/5, bilateral upper and lower extremities.  ADMISSION LABORATORY DATA:  White blood cells 8.8, hemoglobin 13, hematocrit 41, and platelet 194.  Sodium 139, potassium 4.8, chloride 101, bicarb 26, BUN 30, creatinine 2.1, glucose 97, and calcium 11.3. V/Q scan:  Low to intermediate probability for PE.  Bilirubin 0.2. Troponin 0.30.  AST 18, AST 48, and albumin 3.3.  ASSESSMENT AND  PLAN: 1. Chest pain, rule out acute coronary syndrome.  This patient sounds     like gastrointestinal in origin.  Cardiac enzymes x3.  Protonix     changed to b.i.d.  The patient also complaining of mild shortness     of breath.  I will check a BNP.  We will continue with aspirin,     morphine p.r.n., and nitroglycerin p.r.n. 2. Right-sided shoulder pain, likely musculoskeletal pain.  We will     continue Vicodin p.r.n. for pain.  If pain persists, she will need     a further evaluation with imaging. 3. Chronic kidney disease, unclear what this patient's creatinine      baseline is.  In reviewing records from February 2011, her     creatinine was 1.9 and BUN 30.  Today, BUN is 30 and creatinine 2.1     and this may be acute on chronic.   We will repeat renal function.      I will call Dr. Marland Mcalpine office for records.     Depending on records, we will consider renal ultrasound if     appropriate. 4. Hypertension.  We will restart home blood pressure medications. 5. Deep venous thrombosis prophylaxis, sequential compression devices.    Hartley Barefoot, MD    BR/MEDQ  D:  01/02/2011  T:  01/08/2011  Job:  [id number]  Electronically Signed by Hartley Barefoot MD on 01/10/2011 09:05:05 PM

## 2011-01-10 NOTE — Discharge Summary (Signed)
[name], [name]            ACCOUNT NO.:  [id number]  MEDICAL RECORD NO.:  [id number]  LOCATION:  1404                         FACILITY:  [facility]  PHYSICIAN:  Hartley Barefoot, MD    DATE OF BIRTH:  1940-11-28  DATE OF ADMISSION:  01/02/2011 DATE OF DISCHARGE:  01/03/2011                              DISCHARGE SUMMARY   DISCHARGE DIAGNOSES: 1. Chest pain, probably secondary to esophagitis. 2. Shoulder pain, musculoskeletal pain. 3. Rash dermatitis versus some mild cellulitis. 4. Hypertension. 5. Coronary artery disease. 6. Chronic kidney disease stage IV. 7. Chronic obstructive pulmonary disease on chronic oxygen.  DISCHARGE MEDICATIONS: 1. Bactrim 1 tablet p.o. b.i.d. 2. Hydrocodone 5/325 every 6 hours as needed. 3. Crestor 20 mg half a tablet by mouth daily. 4. Protonix 40 mg b.i.d. 5. Cardizem 120 p.o. daily. 6. Depakote 250 mg p.o. daily at bedtime. 7. Aspirin 325 p.o. daily every morning. 8. Fish oil 1000 mg every morning. 9. Hydralazine 25 mg 3 times a day. 10.Imdur 30 mg 1 tablet every morning. 11.Iron 65 two tablets every morning. 12.Levoxyl 88 mcg p.o. every morning. 13.MiraLAX 1 scoop by mouth daily. 14.Red yeast 1 tablet daily. 15.Stool softener 2 tablets daily at bedtime. 16.Tylenol 325 every 6 hours as needed. 17.Women's daily multivitamins p.o. daily. 18.Zetia 10 mg p.o. daily. 19.Albuterol 90 mcg q.6 hours p.r.n.  DISPOSITION AND FOLLOWUP:  [name] will need to follow with her primary care physician within a week to follow her rash.  She will need also to follow with her nephrologist and with her cardiologist for her chronic kidney disease and for coronary artery disease and for her history of heart failure.  RADIOGRAPHIC STUDY: 1. V/Q scan low to intermediate probability for PE. 2. Chest x-ray no acute AFib.  ASSESSMENT AND PLAN: 1. Chest pain likely secondary to esophagitis.  The patient was     started on Protonix, the pain has  improved.  Cardiac enzymes x3     negative.  Also questionable shoulder pain radiating to her chest.     The patient will be discharged on increased doses of Protonix.  If     she tolerates food today, she will be discharged. Nausea has improved. 2. Chronic kidney disease stage IV.  This appeared that this is the     patient's baseline.  I called Dr. Hyman Hopes office and her last     creatinine was 2.3-2.0 was in March.  The patient will need to     follow up with Dr. Hyman Hopes.  She is having good urine output.  Her     renal function is stable. 3. Chronic obstructive pulmonary disease on chronic oxygen.  The     patient Respiratory status is at baseline at this time.      we will discharge her on albuterol as needed. 4. Systolic heart failure.  The patient had mild elevated BNP, but she     does not look fluid overload.  No edema on physical exam.  The     patient will need to follow with her nephrologist and cardiologist     for further recommendations.  She is stable at this time. 5. Rash on her back.  She has some erythematous plaque.  This  could be dermatitis.  I will cover with Bactrim for any     superimposed infection.  She will need to follow up with her     primary care physician.  On the day of discharge, the patient was in improved condition.  Blood pressure 150/77, sat 98 on 2 liter, respirations 27, temperature 97.7, pulse 80, creatinine 2.0, white blood cell 12, hemoglobin 12.     Hartley Barefoot, MD     BR/MEDQ  D:  01/03/2011  T:  01/08/2011  Job:  [id number]  Electronically Signed by Hartley Barefoot MD on 01/10/2011 09:03:02 PM

## 2011-01-14 IMAGING — CT CT CHEST W/O CM
2 of 3 series · 15 of 36 positions shown, 18 images · non-contrast
Comparison: 11/11/2008

CLINICAL DATA: Follow up pulmonary nodule

CT CHEST WITHOUT CONTRAST
TECHNIQUE: Multidetector CT imaging of the chest was performed
following the standard protocol without IV contrast.

[Series 2: routine chest · axial · 0.70mm/px · z∈[-300,-10]mm · 12 of 70 slices shown, 15 images]
[im 6/70  mediastinal]
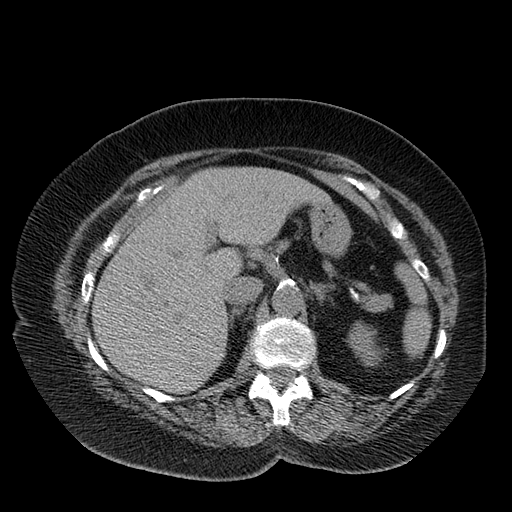
[im 6/70  lung]
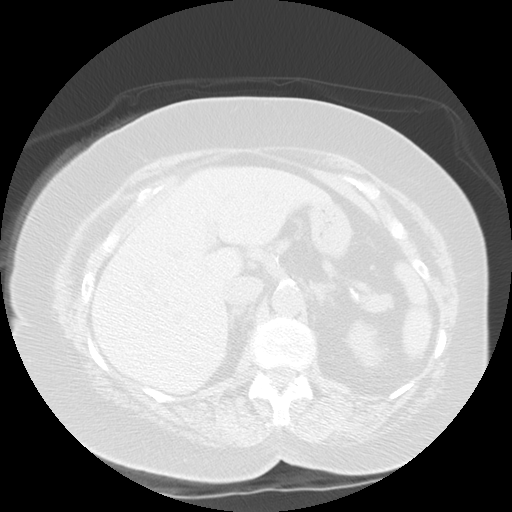
[im 11/70  lung]
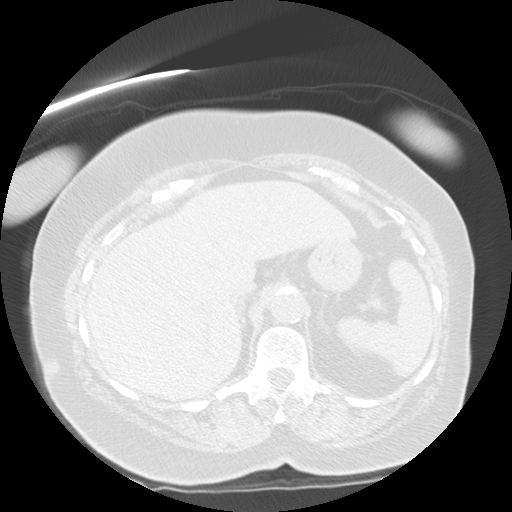
[im 16/70  lung]
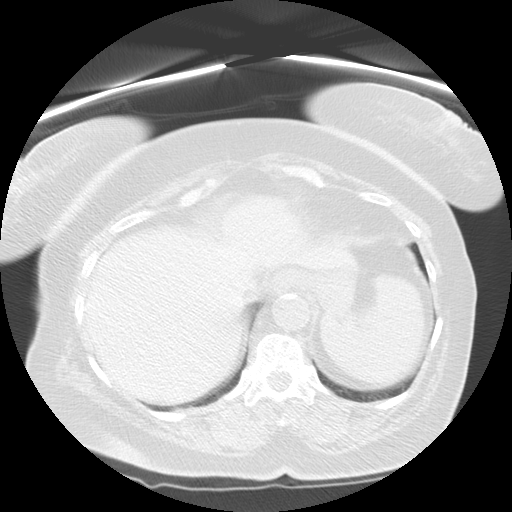
[im 21/70  lung]
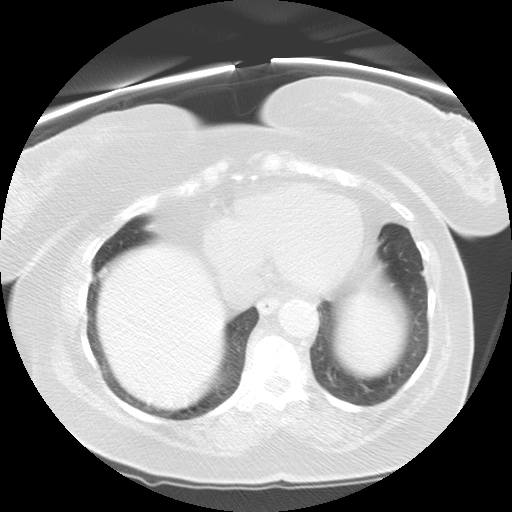
[im 26/70  mediastinal]
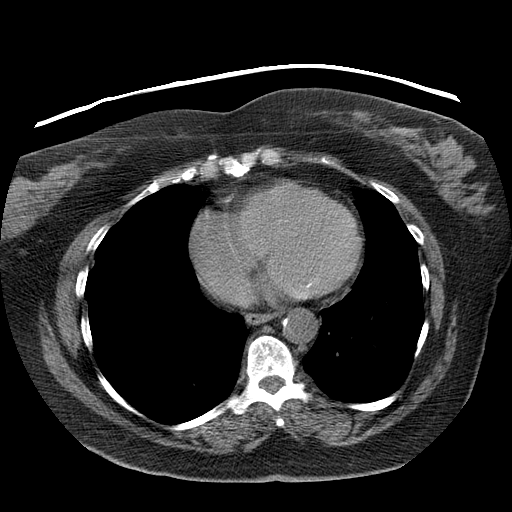
[im 26/70  lung]
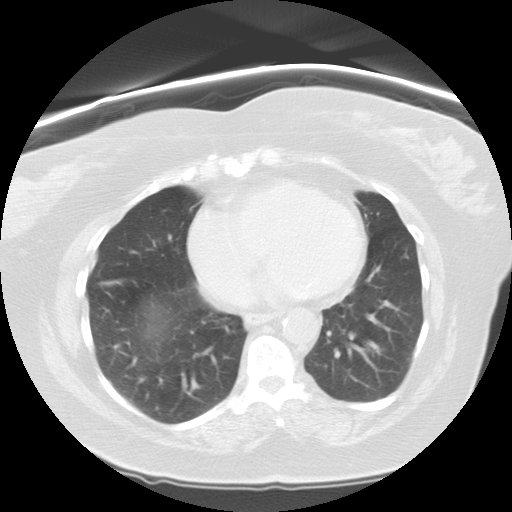
[im 31/70  lung]
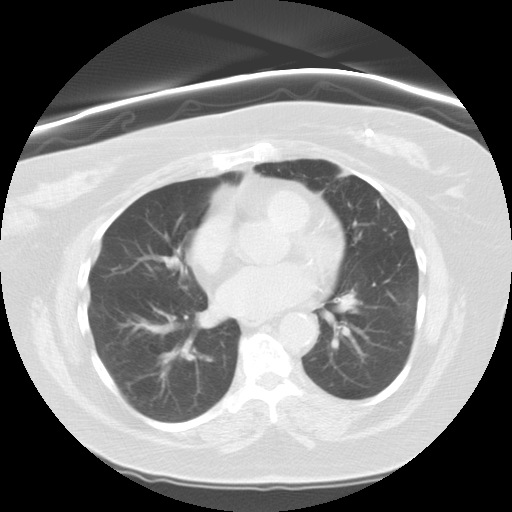
[im 39/70  lung]
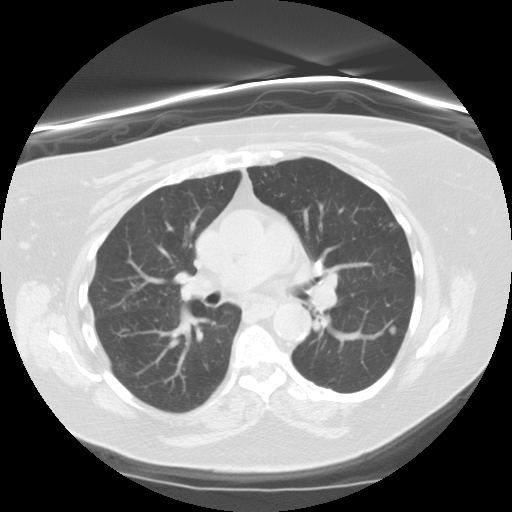
[im 44/70  lung]
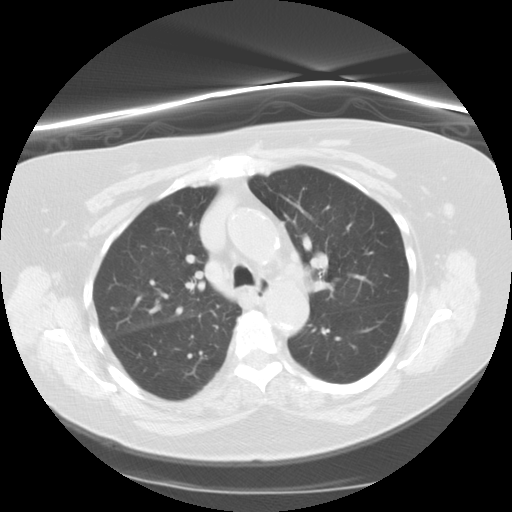
[im 49/70  mediastinal]
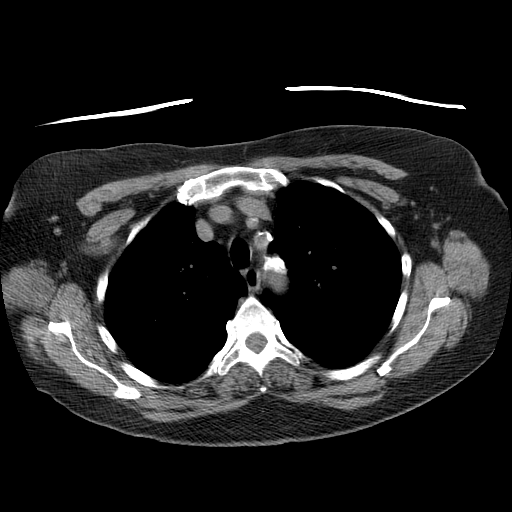
[im 49/70  lung]
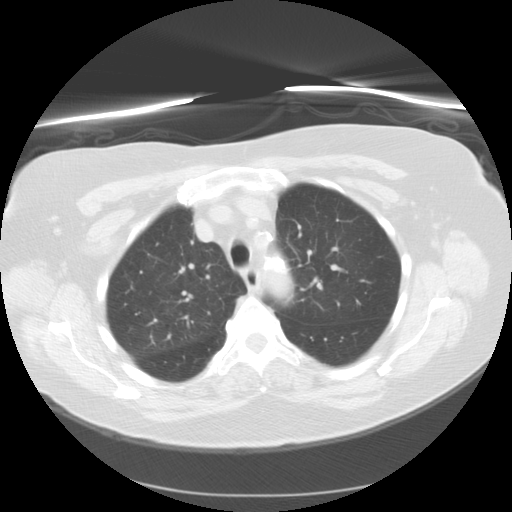
[im 54/70  lung]
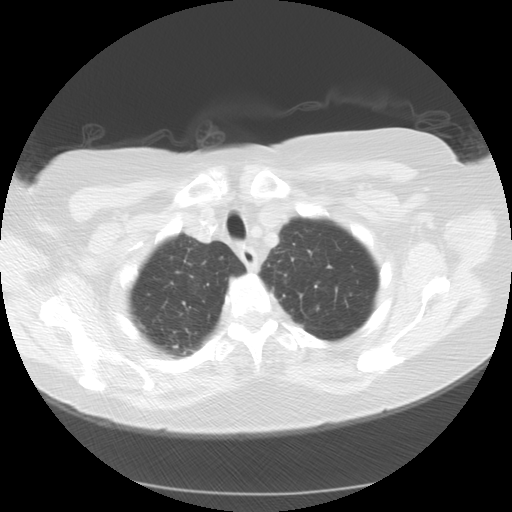
[im 59/70  lung]
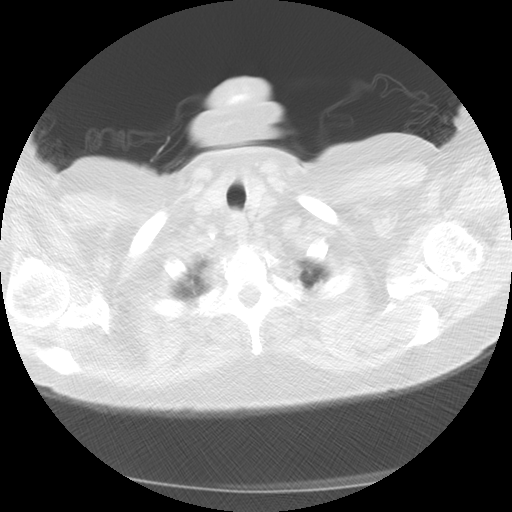
[im 64/70  lung]
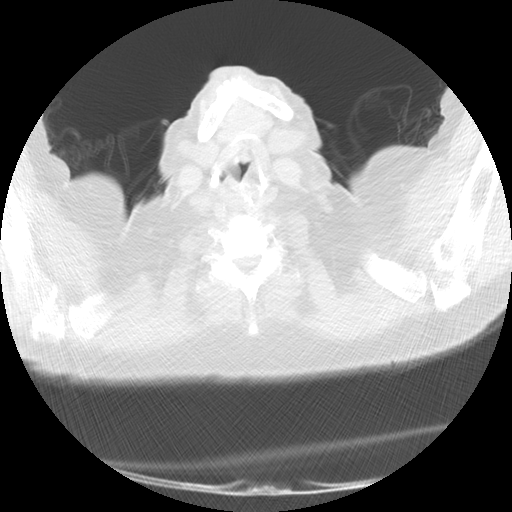

[Series 400: cor · coronal · 0.70mm/px · 3 of 115 slices shown]
[im 23/115  lung]
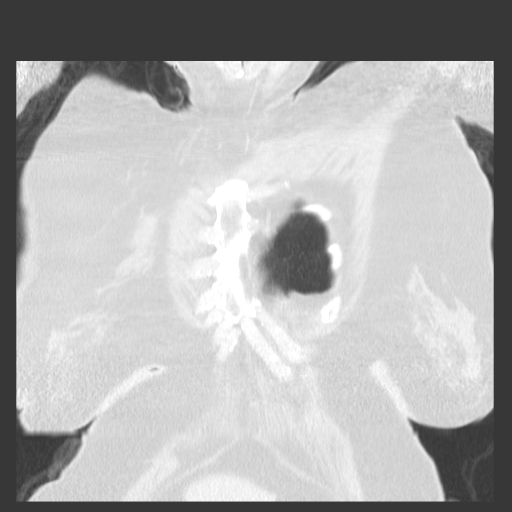
[im 46/115  lung]
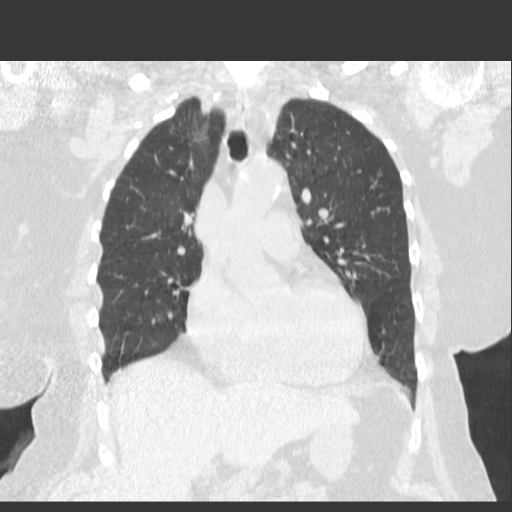
[im 69/115  lung]
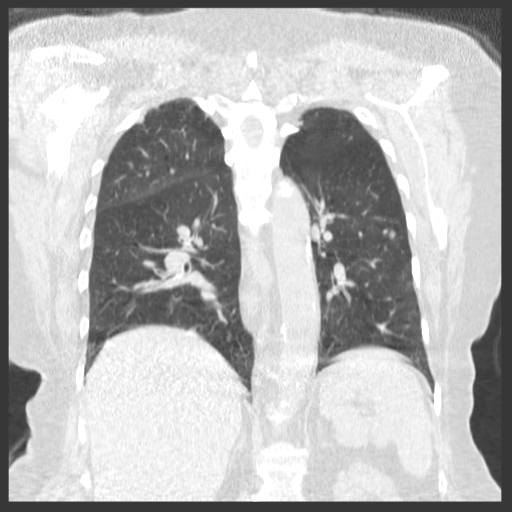

[15 of 36 positions shown; findings below may reference images not displayed]

FINDINGS: No enlarged axillary or supraclavicular lymph nodes.  No
enlarged mediastinal or hilar lymph nodes identified.  No
pericardial or pleural effusions.

The trachea appears midline and is patent.  No endobronchial
lesions identified.  Interval improvement in previously noted
bilateral lower lobe airspace consolidation and atelectasis.  There
are scattered pulmonary parenchymal nodules on today's examination
which have a nonspecific appearance.  For example, within the left
lower lobe there is a nodule measuring 5.1 mm, image 33.  This is
stable from 08/03/2008.  Stable subpleural nodule within the right
lower lobe measuring 5 mm, image 42.  Right middle lobe pulmonary
nodule measures 5 mm, image number 38.  This is stable from
previous exam.

Review of the visualized osseous structures shows multilevel
spondylosis.  No aggressive or suspicious bone lesions identified.
IMPRESSION: 1.  Stable pulmonary nodules compared with 08/03/2008.  The
appearance of these nodules is nonspecific.  These may be secondary
to the inflammation or infection.  In the absence  of a known
malignancy metastatic disease is less favored.

## 2011-01-22 ENCOUNTER — Ambulatory Visit: Payer: Medicare Other | Admitting: Physician Assistant

## 2011-01-23 ENCOUNTER — Encounter: Payer: Self-pay | Admitting: Cardiology

## 2011-01-29 ENCOUNTER — Ambulatory Visit: Payer: Medicare Other | Admitting: Internal Medicine

## 2011-03-14 LAB — POCT CARDIAC MARKERS
Operator id: 192351
Troponin i, poc: <0.05

## 2011-03-14 LAB — POCT I-STAT, CHEM 8
Calcium, Ion: 1.14
Chloride: 101
Glucose, Bld: 115 — ABNORMAL HIGH
HCT: 37
Hemoglobin: 12.6

## 2011-10-15 ENCOUNTER — Ambulatory Visit (INDEPENDENT_AMBULATORY_CARE_PROVIDER_SITE_OTHER): Payer: Medicare Other | Admitting: Internal Medicine

## 2011-10-15 ENCOUNTER — Encounter: Payer: Self-pay | Admitting: Internal Medicine

## 2011-10-15 VITALS — BP 160/76 | HR 96 | Ht 62.0 in | Wt 202.2 lb

## 2011-10-15 DIAGNOSIS — J4489 Other specified chronic obstructive pulmonary disease: Secondary | ICD-10-CM

## 2011-10-15 DIAGNOSIS — R0602 Shortness of breath: Secondary | ICD-10-CM

## 2011-10-15 DIAGNOSIS — J449 Chronic obstructive pulmonary disease, unspecified: Secondary | ICD-10-CM

## 2011-10-15 DIAGNOSIS — D649 Anemia, unspecified: Secondary | ICD-10-CM

## 2011-10-15 MED ORDER — LEVALBUTEROL TARTRATE 45 MCG/ACT IN AERO: 2.0000 | INHALATION_SPRAY | Freq: Four times a day (QID) | RESPIRATORY_TRACT | Status: DC | PRN

## 2011-10-15 NOTE — Progress Notes (Signed)
Patient ID: [name], female    DOB: Sep 29, 1940, 71 y.o.   MRN: [id number]  HPI 10/15/10 71 yo F former smoker followed for COPD and hx lung nodule complicated by CAD, HTN, CHF, now with renal failure, but not yet on dialysis- Dr Hyman Hopes.  Here with husband. Last here April 16, 2010. Hx EF 40-45% as noted in my notes reviewed from last visit.  She notes dyspnea on chronic basis and doesn't feel she could skip O2, but denies chest pain, palpitation, phlegm,  Blood or ankle edema. O2 stays 1.5 to 2 L/M. She hasn't used rescue inhaler in a long time.   10/15/11- 45 yo F former smoker followed for COPD and hx lung nodule complicated by CAD, HTN, CHF, now with renal failure, but not yet on dialysis- Dr Hyman Hopes.  Here with husband.   PCP Dr Creta Levin [place] Has not needed dialysis yet but is being followed very closely by nephrology since last here. Takes Procrit for anemia so we discussed the impact of anemia on shortness of breath. Remains SOB with limited activity.Sensitive to weather change. Little cough. Occ wheeze. Avoids Ventolin-too jittery. CHEST - 2 VIEW 01/02/11: Comparison: CT chest 04/16/2010. Plain films of the chest  07/22/2009 06/22/2009.  Findings: There is some thickening along the minor fissure. Lungs  are clear. No pneumothorax or effusion. Heart size normal.  IMPRESSION:  No acute disease.   ROS-see HPI Constitutional:   No-   weight loss, night sweats, fevers, chills, fatigue, lassitude. HEENT:   No-  headaches, difficulty swallowing, tooth/dental problems, sore throat,       No-  sneezing, itching, ear ache, nasal congestion, post nasal drip,  CV:  No-   chest pain, orthopnea, PND, swelling in lower extremities, anasarca,dizziness, palpitations Resp: +  shortness of breath with exertion or at rest.              No-   productive cough,  No non-productive cough,  No- coughing up of blood.              No-   change in color of mucus.  Little wheezing.     Skin: No-   rash or lesions. GI:  No-   heartburn, indigestion, abdominal pain, nausea, vomiting, diarrhea,                 change in bowel habits, loss of appetite GU: . MS:  No-   joint pain or swelling.   Neuro-     nothing unusual Psych:  No- change in mood or affect. No depression or anxiety.  No memory loss.  OBJ- Physical Exam General- Alert, Oriented, Affect-appropriate, Distress- none acute, wheelchair, O2 2 L Skin- rash-none, lesions- none, excoriation- none Lymphadenopathy- none Head- atraumatic            Eyes- Gross vision intact, PERRLA, conjunctivae and secretions clear            Ears- Hearing, canals-normal            Nose- Clear, no-Septal dev, mucus, polyps, erosion, perforation             Throat- Mallampati II , mucosa clear , drainage- none, tonsils- atrophic Neck- flexible , trachea midline, no stridor , thyroid nl, carotid no bruit Chest - symmetrical excursion , unlabored           Heart/CV- RRR , no murmur , no gallop  , no rub, nl s1 s2                           -  JVD- none , edema- none, stasis changes- none, varices- none           Lung- clear to P&A, wheeze- none, cough- none , dullness-none, rub- none           Chest wall-  Abd-  Br/ Gen/ Rectal- Not done, not indicated Extrem- cyanosis- none, clubbing, none, atrophy- none, strength- nl Neuro- grossly intact to observation

## 2011-10-15 NOTE — Patient Instructions (Signed)
Sample/ script Xopenex HFA rescue inhaler    2 puffs, up to 4 times daily if needed for chest tightness, wheeze, asthma.   Try instead of albuterol for less nervousness.

## 2011-10-18 NOTE — Assessment & Plan Note (Signed)
She would use the Ventolin more if she could tolerate the stimulation. Plan-sample Xopenex HFA inhaler for trial.

## 2011-10-18 NOTE — Assessment & Plan Note (Signed)
Anemia of renal disease Gets Procrit

## 2011-10-21 ENCOUNTER — Other Ambulatory Visit (HOSPITAL_COMMUNITY): Payer: Self-pay | Admitting: *Deleted

## 2011-10-22 ENCOUNTER — Encounter (HOSPITAL_COMMUNITY)
Admission: RE | Admit: 2011-10-22 | Discharge: 2011-10-22 | Disposition: A | Discharge status: Home / Self Care | Payer: Medicare Other | Source: Ambulatory Visit | Attending: Nephrology | Admitting: Nephrology

## 2011-10-22 DIAGNOSIS — D638 Anemia in other chronic diseases classified elsewhere: Secondary | ICD-10-CM | POA: Insufficient documentation

## 2011-10-22 DIAGNOSIS — N183 Chronic kidney disease, stage 3 unspecified: Secondary | ICD-10-CM | POA: Insufficient documentation

## 2011-10-22 MED ORDER — EPOETIN ALFA 20000 UNIT/ML IJ SOLN: 20000.0000 [IU] | INTRAMUSCULAR | Status: DC

## 2011-11-01 ENCOUNTER — Other Ambulatory Visit (HOSPITAL_COMMUNITY): Payer: Self-pay | Admitting: *Deleted

## 2011-11-05 ENCOUNTER — Encounter (HOSPITAL_COMMUNITY)
Admission: RE | Admit: 2011-11-05 | Discharge: 2011-11-05 | Disposition: A | Discharge status: Home / Self Care | Payer: Medicare Other | Source: Ambulatory Visit | Attending: Nephrology | Admitting: Nephrology

## 2011-11-05 LAB — RENAL FUNCTION PANEL
BUN: 37 mg/dL — ABNORMAL HIGH (ref 6–23)
CO2: 27 mEq/L (ref 19–32)
Chloride: 102 mEq/L (ref 96–112)
Creatinine, Ser: 2.39 mg/dL — ABNORMAL HIGH (ref 0.50–1.10)
Glucose, Bld: 108 mg/dL — ABNORMAL HIGH (ref 70–99)
Potassium: 4.9 mEq/L (ref 3.5–5.1)

## 2011-11-05 MED ORDER — EPOETIN ALFA 20000 UNIT/ML IJ SOLN
20000.0000 [IU] | INTRAMUSCULAR | Status: DC
  Administered 2011-11-05: 20000 [IU] via SUBCUTANEOUS
  Filled 2011-11-05: qty 1

## 2011-11-06 LAB — POCT HEMOGLOBIN-HEMACUE: Hemoglobin: 11 g/dL — ABNORMAL LOW (ref 12.0–15.0)

## 2011-11-19 ENCOUNTER — Encounter (HOSPITAL_COMMUNITY)
Admission: RE | Admit: 2011-11-19 | Discharge: 2011-11-19 | Disposition: A | Discharge status: Home / Self Care | Payer: Medicare Other | Source: Ambulatory Visit | Attending: Nephrology | Admitting: Nephrology

## 2011-11-19 DIAGNOSIS — N183 Chronic kidney disease, stage 3 unspecified: Secondary | ICD-10-CM | POA: Insufficient documentation

## 2011-11-19 DIAGNOSIS — D638 Anemia in other chronic diseases classified elsewhere: Secondary | ICD-10-CM | POA: Insufficient documentation

## 2011-11-19 LAB — IRON AND TIBC: Saturation Ratios: 29 % (ref 20–55)

## 2011-11-19 LAB — POCT HEMOGLOBIN-HEMACUE: Hemoglobin: 9.9 g/dL — ABNORMAL LOW (ref 12.0–15.0)

## 2011-11-19 MED ORDER — EPOETIN ALFA 20000 UNIT/ML IJ SOLN
20000.0000 [IU] | INTRAMUSCULAR | Status: DC
  Administered 2011-11-19: 20000 [IU] via SUBCUTANEOUS
  Filled 2011-11-19: qty 1

## 2011-12-03 ENCOUNTER — Encounter (HOSPITAL_COMMUNITY)
Admission: RE | Admit: 2011-12-03 | Discharge: 2011-12-03 | Disposition: A | Discharge status: Home / Self Care | Payer: Medicare Other | Source: Ambulatory Visit | Attending: Nephrology | Admitting: Nephrology

## 2011-12-03 LAB — POCT HEMOGLOBIN-HEMACUE: Hemoglobin: 10.7 g/dL — ABNORMAL LOW (ref 12.0–15.0)

## 2011-12-03 MED ORDER — EPOETIN ALFA 20000 UNIT/ML IJ SOLN
20000.0000 [IU] | INTRAMUSCULAR | Status: DC
  Administered 2011-12-03: 20000 [IU] via SUBCUTANEOUS
  Filled 2011-12-03: qty 1

## 2011-12-17 ENCOUNTER — Encounter (HOSPITAL_COMMUNITY)
Admission: RE | Admit: 2011-12-17 | Discharge: 2011-12-17 | Disposition: A | Discharge status: Home / Self Care | Payer: Medicare Other | Source: Ambulatory Visit | Attending: Nephrology | Admitting: Nephrology

## 2011-12-17 DIAGNOSIS — D638 Anemia in other chronic diseases classified elsewhere: Secondary | ICD-10-CM | POA: Insufficient documentation

## 2011-12-17 DIAGNOSIS — N183 Chronic kidney disease, stage 3 unspecified: Secondary | ICD-10-CM | POA: Insufficient documentation

## 2011-12-17 LAB — IRON AND TIBC
Iron: 105 ug/dL (ref 42–135)
Saturation Ratios: 48 % (ref 20–55)
UIBC: 116 ug/dL — ABNORMAL LOW (ref 125–400)

## 2011-12-17 MED ORDER — EPOETIN ALFA 20000 UNIT/ML IJ SOLN
20000.0000 [IU] | INTRAMUSCULAR | Status: DC
  Administered 2011-12-17: 20000 [IU] via SUBCUTANEOUS

## 2011-12-31 ENCOUNTER — Encounter (HOSPITAL_COMMUNITY)
Admission: RE | Admit: 2011-12-31 | Discharge: 2011-12-31 | Disposition: A | Discharge status: Home / Self Care | Payer: Medicare Other | Source: Ambulatory Visit | Attending: Nephrology | Admitting: Nephrology

## 2011-12-31 MED ORDER — EPOETIN ALFA 20000 UNIT/ML IJ SOLN: 20000.0000 [IU] | INTRAMUSCULAR | Status: DC

## 2011-12-31 MED ORDER — EPOETIN ALFA 20000 UNIT/ML IJ SOLN
INTRAMUSCULAR | Status: AC
  Administered 2011-12-31: 20000 [IU] via SUBCUTANEOUS
  Filled 2011-12-31: qty 1

## 2012-01-14 ENCOUNTER — Encounter (HOSPITAL_COMMUNITY)
Admission: RE | Admit: 2012-01-14 | Discharge: 2012-01-14 | Disposition: A | Discharge status: Home / Self Care | Payer: Medicare Other | Source: Ambulatory Visit | Attending: Nephrology | Admitting: Nephrology

## 2012-01-14 MED ORDER — EPOETIN ALFA 20000 UNIT/ML IJ SOLN: 20000.0000 [IU] | INTRAMUSCULAR | Status: DC

## 2012-01-15 LAB — IRON AND TIBC
Iron: 106 ug/dL (ref 42–135)
TIBC: 272 ug/dL (ref 250–470)

## 2012-01-15 LAB — FERRITIN: Ferritin: 151 ng/mL (ref 10–291)

## 2012-01-28 ENCOUNTER — Encounter (HOSPITAL_COMMUNITY)
Admission: RE | Admit: 2012-01-28 | Discharge: 2012-01-28 | Disposition: A | Discharge status: Home / Self Care | Payer: Medicare Other | Source: Ambulatory Visit | Attending: Nephrology | Admitting: Nephrology

## 2012-01-28 DIAGNOSIS — N183 Chronic kidney disease, stage 3 unspecified: Secondary | ICD-10-CM | POA: Insufficient documentation

## 2012-01-28 DIAGNOSIS — D638 Anemia in other chronic diseases classified elsewhere: Secondary | ICD-10-CM | POA: Insufficient documentation

## 2012-01-28 MED ORDER — EPOETIN ALFA 20000 UNIT/ML IJ SOLN
20000.0000 [IU] | INTRAMUSCULAR | Status: DC
  Administered 2012-01-28: 20000 [IU] via SUBCUTANEOUS

## 2012-01-28 MED ORDER — EPOETIN ALFA 20000 UNIT/ML IJ SOLN
INTRAMUSCULAR | Status: AC
  Filled 2012-01-28: qty 1

## 2012-02-10 ENCOUNTER — Other Ambulatory Visit (HOSPITAL_COMMUNITY): Payer: Self-pay | Admitting: *Deleted

## 2012-02-11 ENCOUNTER — Encounter (HOSPITAL_COMMUNITY)
Admission: RE | Admit: 2012-02-11 | Discharge: 2012-02-11 | Disposition: A | Discharge status: Home / Self Care | Payer: Medicare Other | Source: Ambulatory Visit | Attending: Nephrology | Admitting: Nephrology

## 2012-02-11 ENCOUNTER — Encounter (HOSPITAL_COMMUNITY): Payer: Medicare Other

## 2012-02-11 LAB — POCT HEMOGLOBIN-HEMACUE: Hemoglobin: 11.5 g/dL — ABNORMAL LOW (ref 12.0–15.0)

## 2012-02-11 MED ORDER — EPOETIN ALFA 20000 UNIT/ML IJ SOLN
INTRAMUSCULAR | Status: AC
  Filled 2012-02-11: qty 1

## 2012-02-11 MED ORDER — EPOETIN ALFA 20000 UNIT/ML IJ SOLN
20000.0000 [IU] | INTRAMUSCULAR | Status: DC
  Administered 2012-02-11: 20000 [IU] via SUBCUTANEOUS

## 2012-02-25 ENCOUNTER — Encounter (HOSPITAL_COMMUNITY)
Admission: RE | Admit: 2012-02-25 | Discharge: 2012-02-25 | Disposition: A | Discharge status: Home / Self Care | Payer: Medicare Other | Source: Ambulatory Visit | Attending: Nephrology | Admitting: Nephrology

## 2012-02-25 DIAGNOSIS — D638 Anemia in other chronic diseases classified elsewhere: Secondary | ICD-10-CM | POA: Insufficient documentation

## 2012-02-25 DIAGNOSIS — N183 Chronic kidney disease, stage 3 unspecified: Secondary | ICD-10-CM | POA: Insufficient documentation

## 2012-02-25 MED ORDER — EPOETIN ALFA 20000 UNIT/ML IJ SOLN
INTRAMUSCULAR | Status: AC
  Filled 2012-02-25: qty 1

## 2012-02-25 MED ORDER — EPOETIN ALFA 20000 UNIT/ML IJ SOLN
20000.0000 [IU] | INTRAMUSCULAR | Status: DC
  Administered 2012-02-25: 20000 [IU] via SUBCUTANEOUS

## 2012-02-28 ENCOUNTER — Ambulatory Visit: Payer: Medicare Other | Admitting: Cardiology

## 2012-03-10 ENCOUNTER — Encounter (HOSPITAL_COMMUNITY)
Admission: RE | Admit: 2012-03-10 | Discharge: 2012-03-10 | Disposition: A | Discharge status: Home / Self Care | Payer: Medicare Other | Source: Ambulatory Visit | Attending: Nephrology | Admitting: Nephrology

## 2012-03-10 LAB — POCT HEMOGLOBIN-HEMACUE: Hemoglobin: 12.7 g/dL (ref 12.0–15.0)

## 2012-03-10 MED ORDER — EPOETIN ALFA 20000 UNIT/ML IJ SOLN: 20000.0000 [IU] | INTRAMUSCULAR | Status: DC

## 2012-03-11 LAB — FERRITIN: Ferritin: 88 ng/mL (ref 10–291)

## 2012-03-11 LAB — IRON AND TIBC: TIBC: 307 ug/dL (ref 250–470)

## 2012-03-24 ENCOUNTER — Encounter (HOSPITAL_COMMUNITY)
Admission: RE | Admit: 2012-03-24 | Discharge: 2012-03-24 | Disposition: A | Discharge status: Home / Self Care | Payer: Medicare Other | Source: Ambulatory Visit | Attending: Nephrology | Admitting: Nephrology

## 2012-03-24 DIAGNOSIS — N183 Chronic kidney disease, stage 3 unspecified: Secondary | ICD-10-CM | POA: Insufficient documentation

## 2012-03-24 DIAGNOSIS — D638 Anemia in other chronic diseases classified elsewhere: Secondary | ICD-10-CM | POA: Insufficient documentation

## 2012-03-24 LAB — POCT HEMOGLOBIN-HEMACUE: Hemoglobin: 12.9 g/dL (ref 12.0–15.0)

## 2012-03-24 MED ORDER — EPOETIN ALFA 20000 UNIT/ML IJ SOLN: 20000.0000 [IU] | INTRAMUSCULAR | Status: DC

## 2012-03-25 ENCOUNTER — Ambulatory Visit: Payer: Medicare Other | Admitting: Cardiology

## 2012-03-30 ENCOUNTER — Ambulatory Visit (INDEPENDENT_AMBULATORY_CARE_PROVIDER_SITE_OTHER): Payer: Medicare Other | Admitting: Cardiology

## 2012-03-30 ENCOUNTER — Encounter: Payer: Self-pay | Admitting: Cardiology

## 2012-03-30 VITALS — BP 163/73 | HR 81 | Wt 202.0 lb

## 2012-03-30 DIAGNOSIS — I2510 Atherosclerotic heart disease of native coronary artery without angina pectoris: Secondary | ICD-10-CM

## 2012-03-30 MED ORDER — HYDRALAZINE HCL 50 MG PO TABS: 50.0000 mg | ORAL_TABLET | Freq: Three times a day (TID) | ORAL | Status: DC

## 2012-03-30 NOTE — Assessment & Plan Note (Signed)
Continue statin. 

## 2012-03-30 NOTE — Progress Notes (Signed)
HPI: Pleasant female with a history of COPD, diastolic congestive heart failure and renal insufficiency for f/u. She had a non-ST elevation myocardial infarction previously in the setting of sepsis. An echocardiogram was performed on Nov 05, 2008 and it showed an ejection fraction of 40-45% with wall motion abnormalities. Given renal insufficiency it was felt that medical therapy was indicated. Myoview on January 25, 2009 showed no ischemia or infarction and her ejection fraction was 73%. I last saw her in June of 2012. Since then she has dyspnea with activities, unchanged. There is no PND or pedal edema. Her dyspnea is relieved with rest. There is no associated chest pain. She does not have exertional chest pain, palpitations or syncope.   Current Outpatient Prescriptions  Medication Sig Dispense Refill  . acetaminophen (TYLENOL) 325 MG tablet Take 650 mg by mouth every 6 (six) hours as needed.      Marland Kitchen albuterol (VENTOLIN HFA) 108 (90 BASE) MCG/ACT inhaler Inhale 2 puffs into the lungs every 6 (six) hours as needed.        Marland Kitchen aspirin 325 MG EC tablet Take 325 mg by mouth daily.        Marland Kitchen diltiazem (CARDIZEM CD) 120 MG 24 hr capsule Take 120 mg by mouth daily.        . divalproex (DEPAKOTE) 250 MG EC tablet Take 500 mg by mouth at bedtime.       Marland Kitchen epoetin alfa (EPOGEN,PROCRIT) 40981 UNIT/ML injection Inject 10,000 Units into the skin every 21 ( twenty-one) days.      Marland Kitchen ezetimibe (ZETIA) 10 MG tablet Take 10 mg by mouth daily.        . ferrous sulfate 325 (65 FE) MG EC tablet Take 325 mg by mouth daily with breakfast.      . hydrALAZINE (APRESOLINE) 25 MG tablet Take 25 mg by mouth 3 (three) times daily.        . isosorbide mononitrate (IMDUR) 30 MG 24 hr tablet Take 30 mg by mouth daily.        Marland Kitchen levalbuterol (XOPENEX HFA) 45 MCG/ACT inhaler Inhale 2 puffs into the lungs every 6 (six) hours as needed for wheezing or shortness of breath.  1 Inhaler  prn  . levothyroxine (SYNTHROID, LEVOTHROID) 88 MCG  tablet Take 88 mcg by mouth daily.        Marland Kitchen omega-3 acid ethyl esters (LOVAZA) 1 G capsule Take 2 g by mouth daily.        . pantoprazole (PROTONIX) 40 MG tablet Take 80 mg by mouth daily.       . polyethylene glycol powder (MIRALAX) powder Take 17 g by mouth daily.        . rosuvastatin (CRESTOR) 10 MG tablet Take 10 mg by mouth daily.      Marland Kitchen senna-docusate (SENOKOT-S) 8.6-50 MG per tablet Take 1 tablet by mouth daily.        . meclizine (ANTIVERT) 25 MG tablet Take 25 mg by mouth 3 (three) times daily as needed.         Past Medical History  Diagnosis Date  . Osteoarthritis   . Pelvic inflammatory disease   . 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 MI (myocardial infarction)   . HTN (hypertension)   . Hyperlipidemia   . Hypothyroidism   . Anemia   . Lung nodule   . COPD (chronic obstructive pulmonary disease)   . Renal insufficiency   . DM (diabetes mellitus)   .  CAD (coronary artery disease)     Past Surgical History  Procedure Date  . Back surgery   . Tubal ligation   . Mandible fracture surgery     History   Social History  . Marital Status: Married    Spouse Name: N/A    Number of Children: N/A  . Years of Education: N/A   Occupational History  . Not on file.   Social History Main Topics  . Smoking status: Former Games developer  . Smokeless tobacco: Not on file  . Alcohol Use: Not on file  . Drug Use: Not on file  . Sexually Active: Not on file   Other Topics Concern  . Not on file   Social History Narrative  . No narrative on file    ROS: no fevers or chills, productive cough, hemoptysis, dysphasia, odynophagia, melena, hematochezia, dysuria, hematuria, rash, seizure activity, orthopnea, PND, pedal edema, claudication. Remaining systems are negative.  Physical Exam: Well-developed chronically ill appearing in no acute distress.  Skin is warm and dry.  HEENT is normal.  Neck is supple.  Chest is clear to auscultation with normal  expansion.  Cardiovascular exam is regular rate and rhythm.  Abdominal exam nontender or distended. No masses palpated. Extremities show no edema. neuro grossly intact  ECG sinus rhythm at a rate of 84. Left anterior fascicular block. Incomplete right bundle branch block.

## 2012-03-30 NOTE — Assessment & Plan Note (Signed)
Patient appears to be euvolemic on examination. Continue present dose of diuretic. Potassium and renal function monitored by primary care.

## 2012-03-30 NOTE — Assessment & Plan Note (Signed)
Blood pressure elevated. Increase hydralazine to 50 mg by mouth 3 times a day. 

## 2012-03-30 NOTE — Assessment & Plan Note (Signed)
Presumed secondary to previous MI. However myoview negative. Continue medical therapy including aspirin and statin. We have avoided catheterization secondary to renal insufficiency. Patient also with severe o2 dependent COPD.  

## 2012-03-30 NOTE — Patient Instructions (Addendum)
Your physician wants you to follow-up in: ONE YEAR WITH DR CRENSHAW You will receive a reminder letter in the mail two months in advance. If you don't receive a letter, please call our office to schedule the follow-up appointment.   INCREASE HYDRALAZINE TO 50 MG THREE TIMES DAILY 

## 2012-04-07 ENCOUNTER — Encounter (HOSPITAL_COMMUNITY)
Admission: RE | Admit: 2012-04-07 | Discharge: 2012-04-07 | Disposition: A | Discharge status: Home / Self Care | Payer: Medicare Other | Source: Ambulatory Visit | Attending: Nephrology | Admitting: Nephrology

## 2012-04-07 LAB — IRON AND TIBC
Iron: 99 ug/dL (ref 42–135)
Saturation Ratios: 33 % (ref 20–55)
TIBC: 303 ug/dL (ref 250–470)
UIBC: 204 ug/dL (ref 125–400)

## 2012-04-07 LAB — POCT HEMOGLOBIN-HEMACUE: Hemoglobin: 12.1 g/dL (ref 12.0–15.0)

## 2012-04-07 MED ORDER — EPOETIN ALFA 20000 UNIT/ML IJ SOLN: 20000.0000 [IU] | INTRAMUSCULAR | Status: DC

## 2012-04-08 LAB — FERRITIN: Ferritin: 201 ng/mL (ref 10–291)

## 2012-04-21 ENCOUNTER — Encounter (HOSPITAL_COMMUNITY)
Admission: RE | Admit: 2012-04-21 | Discharge: 2012-04-21 | Disposition: A | Discharge status: Home / Self Care | Payer: Medicare Other | Source: Ambulatory Visit | Attending: Nephrology | Admitting: Nephrology

## 2012-04-21 DIAGNOSIS — N183 Chronic kidney disease, stage 3 unspecified: Secondary | ICD-10-CM | POA: Insufficient documentation

## 2012-04-21 DIAGNOSIS — D638 Anemia in other chronic diseases classified elsewhere: Secondary | ICD-10-CM | POA: Insufficient documentation

## 2012-04-21 MED ORDER — EPOETIN ALFA 20000 UNIT/ML IJ SOLN
20000.0000 [IU] | INTRAMUSCULAR | Status: DC
  Administered 2012-04-21: 20000 [IU] via SUBCUTANEOUS

## 2012-04-21 MED ORDER — EPOETIN ALFA 20000 UNIT/ML IJ SOLN
INTRAMUSCULAR | Status: AC
  Filled 2012-04-21: qty 1

## 2012-05-05 ENCOUNTER — Encounter (HOSPITAL_COMMUNITY)
Admission: RE | Admit: 2012-05-05 | Discharge: 2012-05-05 | Disposition: A | Discharge status: Home / Self Care | Payer: Medicare Other | Source: Ambulatory Visit | Attending: Nephrology | Admitting: Nephrology

## 2012-05-05 LAB — POCT HEMOGLOBIN-HEMACUE: Hemoglobin: 10.8 g/dL — ABNORMAL LOW (ref 12.0–15.0)

## 2012-05-05 MED ORDER — EPOETIN ALFA 20000 UNIT/ML IJ SOLN
INTRAMUSCULAR | Status: AC
  Filled 2012-05-05: qty 1

## 2012-05-05 MED ORDER — EPOETIN ALFA 20000 UNIT/ML IJ SOLN
20000.0000 [IU] | INTRAMUSCULAR | Status: DC
  Administered 2012-05-05: 20000 [IU] via SUBCUTANEOUS

## 2012-05-06 LAB — FERRITIN: Ferritin: 165 ng/mL (ref 10–291)

## 2012-05-18 ENCOUNTER — Other Ambulatory Visit (HOSPITAL_COMMUNITY): Payer: Self-pay | Admitting: *Deleted

## 2012-05-19 ENCOUNTER — Encounter (HOSPITAL_COMMUNITY)
Admission: RE | Admit: 2012-05-19 | Discharge: 2012-05-19 | Disposition: A | Discharge status: Home / Self Care | Payer: Medicare Other | Source: Ambulatory Visit | Attending: Nephrology | Admitting: Nephrology

## 2012-05-19 DIAGNOSIS — N183 Chronic kidney disease, stage 3 unspecified: Secondary | ICD-10-CM | POA: Insufficient documentation

## 2012-05-19 DIAGNOSIS — D638 Anemia in other chronic diseases classified elsewhere: Secondary | ICD-10-CM | POA: Insufficient documentation

## 2012-05-19 MED ORDER — EPOETIN ALFA 20000 UNIT/ML IJ SOLN
INTRAMUSCULAR | Status: AC
  Administered 2012-05-19: 20000 [IU] via SUBCUTANEOUS
  Filled 2012-05-19: qty 1

## 2012-05-19 MED ORDER — EPOETIN ALFA 20000 UNIT/ML IJ SOLN: 20000.0000 [IU] | INTRAMUSCULAR | Status: DC

## 2012-06-02 ENCOUNTER — Encounter (HOSPITAL_COMMUNITY)
Admission: RE | Admit: 2012-06-02 | Discharge: 2012-06-02 | Disposition: A | Discharge status: Home / Self Care | Payer: Medicare Other | Source: Ambulatory Visit | Attending: Nephrology | Admitting: Nephrology

## 2012-06-02 LAB — IRON AND TIBC: TIBC: 297 ug/dL (ref 250–470)

## 2012-06-02 LAB — POCT HEMOGLOBIN-HEMACUE: Hemoglobin: 12.1 g/dL (ref 12.0–15.0)

## 2012-06-02 MED ORDER — EPOETIN ALFA 20000 UNIT/ML IJ SOLN: 20000.0000 [IU] | INTRAMUSCULAR | Status: DC

## 2012-06-15 ENCOUNTER — Encounter (HOSPITAL_COMMUNITY): Payer: Medicare Other

## 2012-06-23 ENCOUNTER — Inpatient Hospital Stay (HOSPITAL_COMMUNITY): Admission: RE | Admit: 2012-06-23 | Payer: Medicare Other | Source: Ambulatory Visit

## 2012-10-07 ENCOUNTER — Other Ambulatory Visit (HOSPITAL_COMMUNITY): Payer: Self-pay | Admitting: *Deleted

## 2012-10-08 ENCOUNTER — Encounter (HOSPITAL_COMMUNITY)
Admission: RE | Admit: 2012-10-08 | Discharge: 2012-10-08 | Disposition: A | Discharge status: Home / Self Care | Payer: Medicare Other | Source: Ambulatory Visit | Attending: Nephrology | Admitting: Nephrology

## 2012-10-08 DIAGNOSIS — N183 Chronic kidney disease, stage 3 unspecified: Secondary | ICD-10-CM | POA: Insufficient documentation

## 2012-10-08 DIAGNOSIS — D638 Anemia in other chronic diseases classified elsewhere: Secondary | ICD-10-CM | POA: Insufficient documentation

## 2012-10-08 LAB — IRON AND TIBC
Iron: 88 ug/dL (ref 42–135)
Saturation Ratios: 29 % (ref 20–55)
TIBC: 300 ug/dL (ref 250–470)
UIBC: 212 ug/dL (ref 125–400)

## 2012-10-08 LAB — FERRITIN: Ferritin: 206 ng/mL (ref 10–291)

## 2012-10-08 MED ORDER — EPOETIN ALFA 20000 UNIT/ML IJ SOLN
INTRAMUSCULAR | Status: AC
  Administered 2012-10-08: 20000 [IU] via SUBCUTANEOUS
  Filled 2012-10-08: qty 1

## 2012-10-08 MED ORDER — EPOETIN ALFA 20000 UNIT/ML IJ SOLN
20000.0000 [IU] | INTRAMUSCULAR | Status: DC
  Administered 2012-10-08: 20000 [IU] via SUBCUTANEOUS

## 2012-10-15 ENCOUNTER — Ambulatory Visit: Payer: Medicare Other | Admitting: Internal Medicine

## 2012-10-20 ENCOUNTER — Encounter (HOSPITAL_COMMUNITY)
Admission: RE | Admit: 2012-10-20 | Discharge: 2012-10-20 | Disposition: A | Discharge status: Home / Self Care | Payer: Medicare Other | Source: Ambulatory Visit | Attending: Nephrology | Admitting: Nephrology

## 2012-10-20 DIAGNOSIS — D638 Anemia in other chronic diseases classified elsewhere: Secondary | ICD-10-CM | POA: Insufficient documentation

## 2012-10-20 DIAGNOSIS — N183 Chronic kidney disease, stage 3 unspecified: Secondary | ICD-10-CM | POA: Insufficient documentation

## 2012-10-20 LAB — POCT HEMOGLOBIN-HEMACUE: Hemoglobin: 10.7 g/dL — ABNORMAL LOW (ref 12.0–15.0)

## 2012-10-20 MED ORDER — EPOETIN ALFA 20000 UNIT/ML IJ SOLN
20000.0000 [IU] | INTRAMUSCULAR | Status: DC
  Administered 2012-10-20: 20000 [IU] via SUBCUTANEOUS

## 2012-10-20 MED ORDER — EPOETIN ALFA 20000 UNIT/ML IJ SOLN
INTRAMUSCULAR | Status: AC
  Filled 2012-10-20: qty 1

## 2012-11-03 ENCOUNTER — Encounter (HOSPITAL_COMMUNITY)
Admission: RE | Admit: 2012-11-03 | Discharge: 2012-11-03 | Disposition: A | Discharge status: Home / Self Care | Payer: Medicare Other | Source: Ambulatory Visit | Attending: Nephrology | Admitting: Nephrology

## 2012-11-03 LAB — IRON AND TIBC
Saturation Ratios: 28 % (ref 20–55)
TIBC: 324 ug/dL (ref 250–470)
UIBC: 234 ug/dL (ref 125–400)

## 2012-11-03 MED ORDER — EPOETIN ALFA 20000 UNIT/ML IJ SOLN: 20000.0000 [IU] | INTRAMUSCULAR | Status: DC

## 2012-11-17 ENCOUNTER — Encounter (HOSPITAL_COMMUNITY)
Admission: RE | Admit: 2012-11-17 | Discharge: 2012-11-17 | Disposition: A | Discharge status: Home / Self Care | Payer: Medicare Other | Source: Ambulatory Visit | Attending: Nephrology | Admitting: Nephrology

## 2012-11-17 DIAGNOSIS — D638 Anemia in other chronic diseases classified elsewhere: Secondary | ICD-10-CM | POA: Insufficient documentation

## 2012-11-17 DIAGNOSIS — N183 Chronic kidney disease, stage 3 unspecified: Secondary | ICD-10-CM | POA: Insufficient documentation

## 2012-11-17 LAB — POCT HEMOGLOBIN-HEMACUE: Hemoglobin: 11.9 g/dL — ABNORMAL LOW (ref 12.0–15.0)

## 2012-11-17 MED ORDER — EPOETIN ALFA 20000 UNIT/ML IJ SOLN
INTRAMUSCULAR | Status: AC
  Filled 2012-11-17: qty 1

## 2012-11-17 MED ORDER — EPOETIN ALFA 20000 UNIT/ML IJ SOLN
20000.0000 [IU] | INTRAMUSCULAR | Status: DC
  Administered 2012-11-17: 20000 [IU] via SUBCUTANEOUS

## 2012-12-01 ENCOUNTER — Encounter (HOSPITAL_COMMUNITY)
Admission: RE | Admit: 2012-12-01 | Discharge: 2012-12-01 | Disposition: A | Discharge status: Home / Self Care | Payer: Medicare Other | Source: Ambulatory Visit | Attending: Nephrology | Admitting: Nephrology

## 2012-12-01 LAB — POCT HEMOGLOBIN-HEMACUE: Hemoglobin: 12.3 g/dL (ref 12.0–15.0)

## 2012-12-01 MED ORDER — EPOETIN ALFA 20000 UNIT/ML IJ SOLN: 20000.0000 [IU] | INTRAMUSCULAR | Status: DC

## 2012-12-02 LAB — FERRITIN: Ferritin: 124 ng/mL (ref 10–291)

## 2012-12-02 LAB — IRON AND TIBC: UIBC: 207 ug/dL (ref 125–400)

## 2012-12-15 ENCOUNTER — Encounter (HOSPITAL_COMMUNITY): Payer: Medicare Other

## 2012-12-21 ENCOUNTER — Other Ambulatory Visit (HOSPITAL_COMMUNITY): Payer: Self-pay | Admitting: *Deleted

## 2012-12-22 ENCOUNTER — Encounter (HOSPITAL_COMMUNITY)
Admission: RE | Admit: 2012-12-22 | Discharge: 2012-12-22 | Disposition: A | Discharge status: Home / Self Care | Payer: Medicare Other | Source: Ambulatory Visit | Attending: Nephrology | Admitting: Nephrology

## 2012-12-22 DIAGNOSIS — D638 Anemia in other chronic diseases classified elsewhere: Secondary | ICD-10-CM | POA: Insufficient documentation

## 2012-12-22 DIAGNOSIS — N183 Chronic kidney disease, stage 3 unspecified: Secondary | ICD-10-CM | POA: Insufficient documentation

## 2012-12-22 MED ORDER — EPOETIN ALFA 20000 UNIT/ML IJ SOLN
INTRAMUSCULAR | Status: AC
  Administered 2012-12-22: 20000 [IU] via SUBCUTANEOUS
  Filled 2012-12-22: qty 1

## 2012-12-22 MED ORDER — EPOETIN ALFA 20000 UNIT/ML IJ SOLN: 20000.0000 [IU] | INTRAMUSCULAR | Status: DC

## 2013-01-05 ENCOUNTER — Encounter (HOSPITAL_COMMUNITY)
Admission: RE | Admit: 2013-01-05 | Discharge: 2013-01-05 | Disposition: A | Discharge status: Home / Self Care | Payer: Medicare Other | Source: Ambulatory Visit | Attending: Nephrology | Admitting: Nephrology

## 2013-01-05 LAB — IRON AND TIBC
Iron: 90 ug/dL (ref 42–135)
Saturation Ratios: 32 % (ref 20–55)
TIBC: 280 ug/dL (ref 250–470)
UIBC: 190 ug/dL (ref 125–400)

## 2013-01-05 LAB — FERRITIN: Ferritin: 212 ng/mL (ref 10–291)

## 2013-01-05 LAB — POCT HEMOGLOBIN-HEMACUE: Hemoglobin: 12 g/dL (ref 12.0–15.0)

## 2013-01-05 MED ORDER — EPOETIN ALFA 20000 UNIT/ML IJ SOLN: 20000.0000 [IU] | INTRAMUSCULAR | Status: DC

## 2013-01-19 ENCOUNTER — Encounter (HOSPITAL_COMMUNITY)
Admission: RE | Admit: 2013-01-19 | Discharge: 2013-01-19 | Disposition: A | Discharge status: Home / Self Care | Payer: Medicare Other | Source: Ambulatory Visit | Attending: Nephrology | Admitting: Nephrology

## 2013-01-19 DIAGNOSIS — D638 Anemia in other chronic diseases classified elsewhere: Secondary | ICD-10-CM | POA: Insufficient documentation

## 2013-01-19 DIAGNOSIS — N183 Chronic kidney disease, stage 3 unspecified: Secondary | ICD-10-CM | POA: Insufficient documentation

## 2013-01-19 MED ORDER — EPOETIN ALFA 20000 UNIT/ML IJ SOLN: 20000.0000 [IU] | INTRAMUSCULAR | Status: DC

## 2013-01-19 MED ORDER — EPOETIN ALFA 20000 UNIT/ML IJ SOLN
INTRAMUSCULAR | Status: AC
  Administered 2013-01-19: 20000 [IU] via SUBCUTANEOUS
  Filled 2013-01-19: qty 1

## 2013-02-02 ENCOUNTER — Encounter (HOSPITAL_COMMUNITY)
Admission: RE | Admit: 2013-02-02 | Discharge: 2013-02-02 | Disposition: A | Discharge status: Home / Self Care | Payer: Medicare Other | Source: Ambulatory Visit | Attending: Nephrology | Admitting: Nephrology

## 2013-02-02 LAB — POCT HEMOGLOBIN-HEMACUE: Hemoglobin: 11.4 g/dL — ABNORMAL LOW (ref 12.0–15.0)

## 2013-02-02 LAB — FERRITIN: Ferritin: 159 ng/mL (ref 10–291)

## 2013-02-02 LAB — IRON AND TIBC: TIBC: 296 ug/dL (ref 250–470)

## 2013-02-02 MED ORDER — EPOETIN ALFA 20000 UNIT/ML IJ SOLN
INTRAMUSCULAR | Status: AC
  Filled 2013-02-02: qty 1

## 2013-02-02 MED ORDER — EPOETIN ALFA 20000 UNIT/ML IJ SOLN
20000.0000 [IU] | INTRAMUSCULAR | Status: DC
  Administered 2013-02-02: 20000 [IU] via SUBCUTANEOUS

## 2013-02-16 ENCOUNTER — Encounter (HOSPITAL_COMMUNITY)
Admission: RE | Admit: 2013-02-16 | Discharge: 2013-02-16 | Disposition: A | Discharge status: Home / Self Care | Payer: Medicare Other | Source: Ambulatory Visit | Attending: Nephrology | Admitting: Nephrology

## 2013-02-16 DIAGNOSIS — D638 Anemia in other chronic diseases classified elsewhere: Secondary | ICD-10-CM | POA: Insufficient documentation

## 2013-02-16 DIAGNOSIS — N183 Chronic kidney disease, stage 3 unspecified: Secondary | ICD-10-CM | POA: Insufficient documentation

## 2013-02-16 MED ORDER — EPOETIN ALFA 20000 UNIT/ML IJ SOLN
20000.0000 [IU] | INTRAMUSCULAR | Status: DC
  Administered 2013-02-16: 20000 [IU] via SUBCUTANEOUS

## 2013-02-16 MED ORDER — EPOETIN ALFA 20000 UNIT/ML IJ SOLN
INTRAMUSCULAR | Status: AC
  Filled 2013-02-16: qty 1

## 2013-03-02 ENCOUNTER — Encounter: Payer: Self-pay | Admitting: Cardiology

## 2013-03-02 ENCOUNTER — Encounter (HOSPITAL_COMMUNITY): Payer: Medicare Other

## 2013-03-02 ENCOUNTER — Encounter (HOSPITAL_COMMUNITY)
Admission: RE | Admit: 2013-03-02 | Discharge: 2013-03-02 | Disposition: A | Discharge status: Home / Self Care | Payer: Medicare Other | Source: Ambulatory Visit | Attending: Nephrology | Admitting: Nephrology

## 2013-03-02 LAB — POCT HEMOGLOBIN-HEMACUE: Hemoglobin: 11.5 g/dL — ABNORMAL LOW (ref 12.0–15.0)

## 2013-03-02 LAB — IRON AND TIBC
Saturation Ratios: 23 % (ref 20–55)
UIBC: 216 ug/dL (ref 125–400)

## 2013-03-02 MED ORDER — EPOETIN ALFA 20000 UNIT/ML IJ SOLN
INTRAMUSCULAR | Status: AC
  Filled 2013-03-02: qty 1

## 2013-03-02 MED ORDER — EPOETIN ALFA 20000 UNIT/ML IJ SOLN
20000.0000 [IU] | INTRAMUSCULAR | Status: DC
  Administered 2013-03-02: 20000 [IU] via SUBCUTANEOUS

## 2013-03-16 ENCOUNTER — Encounter (HOSPITAL_COMMUNITY)
Admission: RE | Admit: 2013-03-16 | Discharge: 2013-03-16 | Disposition: A | Discharge status: Home / Self Care | Payer: Medicare Other | Source: Ambulatory Visit | Attending: Nephrology | Admitting: Nephrology

## 2013-03-16 LAB — POCT HEMOGLOBIN-HEMACUE: Hemoglobin: 11.8 g/dL — ABNORMAL LOW (ref 12.0–15.0)

## 2013-03-16 MED ORDER — EPOETIN ALFA 20000 UNIT/ML IJ SOLN
INTRAMUSCULAR | Status: AC
  Administered 2013-03-16: 20000 [IU] via SUBCUTANEOUS
  Filled 2013-03-16: qty 1

## 2013-03-16 MED ORDER — EPOETIN ALFA 20000 UNIT/ML IJ SOLN: 20000.0000 [IU] | INTRAMUSCULAR | Status: DC

## 2013-03-29 ENCOUNTER — Encounter: Payer: Self-pay | Admitting: Internal Medicine

## 2013-03-29 ENCOUNTER — Ambulatory Visit (INDEPENDENT_AMBULATORY_CARE_PROVIDER_SITE_OTHER): Payer: Medicare Other | Admitting: Internal Medicine

## 2013-03-29 ENCOUNTER — Ambulatory Visit (INDEPENDENT_AMBULATORY_CARE_PROVIDER_SITE_OTHER)
Admission: RE | Admit: 2013-03-29 | Discharge: 2013-03-29 | Disposition: A | Discharge status: Home / Self Care | Payer: Medicare Other | Source: Ambulatory Visit | Attending: Internal Medicine | Admitting: Internal Medicine

## 2013-03-29 VITALS — BP 128/76 | HR 90 | Ht 62.0 in | Wt 183.0 lb

## 2013-03-29 DIAGNOSIS — J441 Chronic obstructive pulmonary disease with (acute) exacerbation: Secondary | ICD-10-CM

## 2013-03-29 MED ORDER — ALBUTEROL SULFATE HFA 108 (90 BASE) MCG/ACT IN AERS: 2.0000 | INHALATION_SPRAY | Freq: Four times a day (QID) | RESPIRATORY_TRACT | Status: DC | PRN

## 2013-03-29 NOTE — Patient Instructions (Addendum)
Order- CXR COPD  Script for albuterol rescue inhaler

## 2013-03-29 NOTE — Progress Notes (Signed)
Patient ID: [name], female    DOB: 1940/12/04, 72 y.o.   MRN: [id number]  HPI 10/15/10 72 yo F former smoker followed for COPD and hx lung nodule complicated by CAD, HTN, CHF, now with renal failure, but not yet on dialysis- Dr Hyman Hopes.  Here with husband. Last here April 16, 2010. Hx EF 40-45% as noted in my notes reviewed from last visit.  She notes dyspnea on chronic basis and doesn't feel she could skip O2, but denies chest pain, palpitation, phlegm,  Blood or ankle edema. O2 stays 1.5 to 2 L/M. She hasn't used rescue inhaler in a long time.   10/15/11- 41 yo F former smoker followed for COPD and hx lung nodule complicated by CAD, HTN, CHF, now with renal failure, but not yet on dialysis- Dr Hyman Hopes.  Here with husband.   PCP Dr Creta Levin [place] Has not needed dialysis yet but is being followed very closely by nephrology since last here. Takes Procrit for anemia so we discussed the impact of anemia on shortness of breath. Remains SOB with limited activity.Sensitive to weather change. Little cough. Occ wheeze. Avoids Ventolin-too jittery. CHEST - 2 VIEW 01/02/11: Comparison: CT chest 04/16/2010. Plain films of the chest  07/22/2009 06/22/2009.  Findings: There is some thickening along the minor fissure. Lungs  are clear. No pneumothorax or effusion. Heart size normal.  IMPRESSION:  No acute disease.   03/29/13- 69 yo F former smoker followed for COPD and hx lung nodule complicated by CAD, HTN, CHF, now with renal failure, but not yet on dialysis- Dr Hyman Hopes.  Here with husband.   PCP Dr Creta Levin [facility]  FOLLOWS FOR: continues to have SOB with activity; has started doing slight exercise. O2 1.5.-2 L/ [facility] for sleep and portable. Feels she needs O2 for sustained walking.  Denies wheeze, cough or need for rescue inhaler. +dependent edema.  ROS-see HPI Constitutional:   No-   weight loss, night sweats, fevers, chills, fatigue,  lassitude. HEENT:   No-  headaches, difficulty swallowing, tooth/dental problems, sore throat,       No-  sneezing, itching, ear ache, nasal congestion, post nasal drip,  CV:  No-   chest pain, orthopnea, PND, swelling in lower extremities, anasarca,dizziness, palpitations Resp: +  shortness of breath with exertion or at rest.              No-   productive cough,  No non-productive cough,  No- coughing up of blood.              No-   change in color of mucus.  Little wheezing.   Skin: No-   rash or lesions. GI:  No-   heartburn, indigestion, abdominal pain, nausea, vomiting,  GU: . MS:  No-   joint pain or swelling.   Neuro-     nothing unusual Psych:  No- change in mood or affect. No depression or anxiety.  No memory loss.  OBJ- Physical Exam General- Alert, Oriented, Affect-appropriate, Distress- none acute, wheelchair, O2 2 L Skin- rash-none, lesions- none, excoriation- none Lymphadenopathy- none Head- atraumatic            Eyes- Gross vision intact, PERRLA, conjunctivae and secretions clear            Ears- Hearing, canals-normal            Nose- Clear, no-Septal dev, mucus, polyps, erosion, perforation             Throat-  Mallampati II , mucosa clear , drainage- none, tonsils- atrophic. +dentures upper,                  missing teeth Neck- flexible , trachea midline, no stridor , thyroid nl, carotid no bruit Chest - symmetrical excursion , unlabored           Heart/CV- RRR , no murmur , no gallop  , no rub, nl s1 s2                           - JVD- none , edema+1, stasis changes- none, varices- none           Lung- clear to P&A, wheeze- none, cough- none , dullness-none, rub- none           Chest wall-  Abd-  Br/ Gen/ Rectal- Not done, not indicated Extrem- cyanosis- none, clubbing, none, atrophy- none, strength- nl Neuro- grossly intact to observation

## 2013-03-30 ENCOUNTER — Encounter (HOSPITAL_COMMUNITY): Payer: Medicare Other

## 2013-03-30 ENCOUNTER — Telehealth: Payer: Self-pay | Admitting: Internal Medicine

## 2013-03-30 DIAGNOSIS — R911 Solitary pulmonary nodule: Secondary | ICD-10-CM

## 2013-03-30 NOTE — Telephone Encounter (Signed)
Meghan spoke with patient about results; pt aware that she needs CT chest without contrast-order placed to [facility] and they will contact patient to inform of date, time, and location of test.

## 2013-03-30 NOTE — Progress Notes (Signed)
Quick Note:  Advised pt of CXR results per CY and advised pt of recommendation  Of CT w/o contrast DX: Lung nodules ______

## 2013-03-31 ENCOUNTER — Other Ambulatory Visit (HOSPITAL_COMMUNITY): Payer: Self-pay | Admitting: *Deleted

## 2013-03-31 ENCOUNTER — Telehealth: Payer: Self-pay | Admitting: Internal Medicine

## 2013-03-31 NOTE — Telephone Encounter (Signed)
Spoke with patient; she will be here on Tuesday 04-06-13 at 11:00am for 11:15am appointment with CY to follow up on CT Scan.

## 2013-04-01 ENCOUNTER — Ambulatory Visit (INDEPENDENT_AMBULATORY_CARE_PROVIDER_SITE_OTHER): Payer: Medicare Other | Admitting: Cardiology

## 2013-04-01 ENCOUNTER — Ambulatory Visit (INDEPENDENT_AMBULATORY_CARE_PROVIDER_SITE_OTHER)
Admission: RE | Admit: 2013-04-01 | Discharge: 2013-04-01 | Disposition: A | Discharge status: Home / Self Care | Payer: Medicare Other | Source: Ambulatory Visit | Attending: Internal Medicine | Admitting: Internal Medicine

## 2013-04-01 ENCOUNTER — Encounter: Payer: Self-pay | Admitting: Cardiology

## 2013-04-01 ENCOUNTER — Encounter (HOSPITAL_COMMUNITY)
Admission: RE | Admit: 2013-04-01 | Discharge: 2013-04-01 | Disposition: A | Discharge status: Home / Self Care | Payer: Medicare Other | Source: Ambulatory Visit | Attending: Nephrology | Admitting: Nephrology

## 2013-04-01 VITALS — BP 138/82 | HR 87 | Ht 62.0 in | Wt 181.0 lb

## 2013-04-01 DIAGNOSIS — D638 Anemia in other chronic diseases classified elsewhere: Secondary | ICD-10-CM | POA: Insufficient documentation

## 2013-04-01 DIAGNOSIS — N259 Disorder resulting from impaired renal tubular function, unspecified: Secondary | ICD-10-CM

## 2013-04-01 DIAGNOSIS — I2510 Atherosclerotic heart disease of native coronary artery without angina pectoris: Secondary | ICD-10-CM

## 2013-04-01 DIAGNOSIS — N183 Chronic kidney disease, stage 3 unspecified: Secondary | ICD-10-CM | POA: Insufficient documentation

## 2013-04-01 DIAGNOSIS — R911 Solitary pulmonary nodule: Secondary | ICD-10-CM

## 2013-04-01 DIAGNOSIS — I10 Essential (primary) hypertension: Secondary | ICD-10-CM

## 2013-04-01 LAB — IRON AND TIBC
Iron: 28 ug/dL — ABNORMAL LOW (ref 42–135)
Saturation Ratios: 11 % — ABNORMAL LOW (ref 20–55)
UIBC: 231 ug/dL (ref 125–400)

## 2013-04-01 LAB — POCT HEMOGLOBIN-HEMACUE: Hemoglobin: 13 g/dL (ref 12.0–15.0)

## 2013-04-01 MED ORDER — HYDRALAZINE HCL 25 MG PO TABS: 25.0000 mg | ORAL_TABLET | Freq: Two times a day (BID) | ORAL | Status: DC

## 2013-04-01 MED ORDER — EPOETIN ALFA 20000 UNIT/ML IJ SOLN: 20000.0000 [IU] | INTRAMUSCULAR | Status: DC

## 2013-04-01 NOTE — Progress Notes (Signed)
HPI: Pleasant female with a history of COPD, diastolic congestive heart failure, CAD and renal insufficiency for f/u. She had a non-ST elevation myocardial infarction previously in the setting of sepsis. An echocardiogram was performed on Nov 05, 2008 and it showed an ejection fraction of 40-45% with wall motion abnormalities. Given renal insufficiency it was felt that medical therapy was indicated. Myoview on January 25, 2009 showed no ischemia or infarction and her ejection fraction was 73%. I last saw her in Oct 2013. Since then, she has dyspnea on exertion which is unchanged. No orthopnea or PND. No chest pain or syncope. Occasional mild pedal edema. She discontinued her Lasix on her own.  Current Outpatient Prescriptions  Medication Sig Dispense Refill  . acetaminophen (TYLENOL) 325 MG tablet Take 650 mg by mouth every 6 (six) hours as needed.      Marland Kitchen albuterol (VENTOLIN HFA) 108 (90 BASE) MCG/ACT inhaler Inhale 2 puffs into the lungs every 6 (six) hours as needed.  1 Inhaler  prn  . aspirin 325 MG EC tablet Take 325 mg by mouth daily.        Marland Kitchen diltiazem (CARDIZEM CD) 120 MG 24 hr capsule Take 120 mg by mouth daily.        Marland Kitchen ezetimibe (ZETIA) 10 MG tablet Take 10 mg by mouth daily.        . hydrALAZINE (APRESOLINE) 50 MG tablet Take 1 tablet (50 mg total) by mouth 3 (three) times daily.  270 tablet  3  . isosorbide mononitrate (IMDUR) 30 MG 24 hr tablet Take 30 mg by mouth daily.        Marland Kitchen omega-3 acid ethyl esters (LOVAZA) 1 G capsule Take 4 g by mouth daily.       . pantoprazole (PROTONIX) 40 MG tablet Take 80 mg by mouth daily.       . rosuvastatin (CRESTOR) 10 MG tablet Take 10 mg by mouth daily.       No current facility-administered medications for this visit.     Past Medical History  Diagnosis Date  . Osteoarthritis   . Pelvic inflammatory disease   . 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 MI (myocardial infarction)   . HTN (hypertension)   .  Hyperlipidemia   . Hypothyroidism   . Anemia   . Lung nodule   . COPD (chronic obstructive pulmonary disease)   . Renal insufficiency   . DM (diabetes mellitus)   . CAD (coronary artery disease)     Past Surgical History  Procedure Laterality Date  . Back surgery    . Tubal ligation    . Mandible fracture surgery      History   Social History  . Marital Status: Married    Spouse Name: N/A    Number of Children: N/A  . Years of Education: N/A   Occupational History  . Not on file.   Social History Main Topics  . Smoking status: Former Games developer  . Smokeless tobacco: Not on file  . Alcohol Use: Not on file  . Drug Use: Not on file  . Sexual Activity: Not on file   Other Topics Concern  . Not on file   Social History Narrative  . No narrative on file    ROS: no fevers or chills, productive cough, hemoptysis, dysphasia, odynophagia, melena, hematochezia, dysuria, hematuria, rash, seizure activity, orthopnea, PND, pedal edema, claudication. Remaining systems are negative.  Physical Exam: Well-developed well-nourished in no  acute distress.  Skin is warm and dry.  HEENT is normal.  Neck is supple.  Chest is clear to auscultation with normal expansion.  Cardiovascular exam is regular rate and rhythm.  Abdominal exam nontender or distended. No masses palpated. Extremities show trace edema. neuro grossly intact  ECG sinus rhythm at a rate of 87. Left anterior fascicular block. RV conduction delay.

## 2013-04-01 NOTE — Assessment & Plan Note (Signed)
Presumed secondary to previous MI. However myoview negative. Continue medical therapy including aspirin and statin. We have avoided catheterization secondary to renal insufficiency. Patient also with severe o2 dependent COPD.

## 2013-04-01 NOTE — Assessment & Plan Note (Signed)
Followed by nephrology. 

## 2013-04-01 NOTE — Assessment & Plan Note (Signed)
Ask her to take her Lasix as needed for weight gain or edema.

## 2013-04-01 NOTE — Patient Instructions (Signed)
Your physician wants you to follow-up in: ONE YEAR WITH DR Shelda Pal will receive a reminder letter in the mail two months in advance. If you don't receive a letter, please call our office to schedule the follow-up appointment.   DECREASE HYDRALAZINE TO 25 MG TWICE DAILY  TAKE FUROSEMIDE DAILY AS NEEDED FOR SWELLING OR SOB

## 2013-04-01 NOTE — Assessment & Plan Note (Signed)
Continue statin. 

## 2013-04-01 NOTE — Assessment & Plan Note (Signed)
She would like to decrease her hydralazine to 25 mg twice a day. Have asked her to follow her blood pressure closely and we can increase further as needed.

## 2013-04-02 ENCOUNTER — Telehealth: Payer: Self-pay | Admitting: Internal Medicine

## 2013-04-02 NOTE — Telephone Encounter (Signed)
I spoke with pt daughter. She wanted to go over pt CT results with her again but with Dr. Maple Hudson. Daughter was already made aware according to earlier phone note. She stated pt has been crying over the results and saying she is on her "death bed". Daughter was told by the patient at her next OV she needed to bring someone other than her husband for her results. Please advise Dr. Maple Hudson thanks

## 2013-04-02 NOTE — Telephone Encounter (Signed)
Called and spoke with pts daughter.  She stated that her mother has not been acting right in the last couple of months.  She stated that the pt could not tell her why they did the cxr or the ct scan.  i reviewed these test results with the pts daughter and advised her that CY will review these at the appt on Tuesday.  She will try to come in to the appt with her.  She stated that her mother forgets things easily and wants to argue over simple things and then forgets that they argued.  i advised the daughter that she should call her primary care doctor and schedule an appt with them for further eval of these symptoms. She will call and set up appt for this.  Nothing further is needed.

## 2013-04-02 NOTE — Telephone Encounter (Signed)
This is no emergency. We will discuss the CT scan as schedudled on Tuesday.

## 2013-04-02 NOTE — Telephone Encounter (Signed)
Called and spoke with pts daughter and she is aware of CY recs.  She is aware that she is allowed to come to the appt with the pt.

## 2013-04-02 NOTE — Progress Notes (Signed)
Quick Note:  Advised pt of CT results. Pt verbalized understanding. Confirmed pt's appt. 03/2113 @ 11:15 ______

## 2013-04-06 ENCOUNTER — Encounter: Payer: Self-pay | Admitting: Internal Medicine

## 2013-04-06 ENCOUNTER — Ambulatory Visit (INDEPENDENT_AMBULATORY_CARE_PROVIDER_SITE_OTHER): Payer: Medicare Other | Admitting: Internal Medicine

## 2013-04-06 VITALS — BP 126/62 | HR 81 | Ht 62.0 in | Wt 176.8 lb

## 2013-04-06 DIAGNOSIS — I5040 Unspecified combined systolic (congestive) and diastolic (congestive) heart failure: Secondary | ICD-10-CM

## 2013-04-06 DIAGNOSIS — J441 Chronic obstructive pulmonary disease with (acute) exacerbation: Secondary | ICD-10-CM

## 2013-04-06 DIAGNOSIS — J984 Other disorders of lung: Secondary | ICD-10-CM

## 2013-04-06 NOTE — Patient Instructions (Signed)
We will follow the changes on your xrays to see if there is anything active going on.   Plan- order future CT chest no contrast in 4 months before next visit   Dx COPD, Chronic bronchitis

## 2013-04-06 NOTE — Progress Notes (Signed)
Patient ID: [name], female    DOB: 03/30/1941, 72 y.o.   MRN: [id number]  HPI 10/15/10 72 yo F former smoker followed for COPD and hx lung nodule complicated by CAD, HTN, CHF, now with renal failure, but not yet on dialysis- Dr Hyman Hopes.  Here with husband. Last here April 16, 2010. Hx EF 40-45% as noted in my notes reviewed from last visit.  She notes dyspnea on chronic basis and doesn't feel she could skip O2, but denies chest pain, palpitation, phlegm,  Blood or ankle edema. O2 stays 1.5 to 2 L/M. She hasn't used rescue inhaler in a long time.   10/15/11- 72 yo F former smoker followed for COPD and hx lung nodule complicated by CAD, HTN, CHF, now with renal failure, but not yet on dialysis- Dr Hyman Hopes.  Here with husband.   PCP Dr Creta Levin [place] Has not needed dialysis yet but is being followed very closely by nephrology since last here. Takes Procrit for anemia so we discussed the impact of anemia on shortness of breath. Remains SOB with limited activity.Sensitive to weather change. Little cough. Occ wheeze. Avoids Ventolin-too jittery. CHEST - 2 VIEW 01/02/11: Comparison: CT chest 04/16/2010. Plain films of the chest  07/22/2009 06/22/2009.  Findings: There is some thickening along the minor fissure. Lungs  are clear. No pneumothorax or effusion. Heart size normal.  IMPRESSION:  No acute disease.   03/29/13- 87 yo F former smoker followed for COPD and hx lung nodule complicated by CAD, HTN, CHF, now with renal failure, but not yet on dialysis- Dr Hyman Hopes.  Here with husband.   PCP Dr Creta Levin [facility] FOLLOWS FOR: continues to have SOB with activity; has started doing slight exercise.  04/06/13- 42 yo F former smoker followed for COPD and hx lung nodule complicated by CAD, HTN, CHF, now with renal failure, but not yet on dialysis- Dr Hyman Hopes.  Here with daughter and  husband.   PCP Dr Creta Levin [facility] FOLLOWS FOR: review CT Chest  results with patient and family. Nurse reports patient has stopped depakote/ behavioral meds No fever or sweats. Cough only from her denture powder. O2 1.5-2L/ sleep/ [facility] Abnl CXR-> CT chest 04/01/13-  IMPRESSION:  1. 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  2. Atherosclerosis, including left main and 3 vessel coronary artery  disease. Assessment for potential risk factor modification, dietary  therapy or pharmacologic therapy may be warranted, if clinically  indicated.  3. Mild centrilobular emphysema.  Electronically Signed  By: Trudie Reed M.D.  On: 04/01/2013 18:39   ROS-see HPI Constitutional:   No-   weight loss, night sweats, fevers, chills, fatigue, lassitude. HEENT:   No-  headaches, difficulty swallowing, tooth/dental problems, sore throat,       No-  sneezing, itching, ear ache, nasal congestion, post nasal drip,  CV:  No-   chest pain, orthopnea, PND, swelling in lower extremities, anasarca,dizziness, palpitations Resp: +  shortness of breath with exertion or at rest.              No-   productive cough,  No non-productive cough,  No- coughing up of blood.              No-   change in color of mucus.  Little wheezing.  Skin: No-   rash or lesions. GI:  No-   heartburn, indigestion, abdominal pain, nausea, vomiting, GU: . MS:  No-   joint pain or swelling.   Neuro-     nothing unusual Psych:  No- change in mood or affect. No depression or anxiety.  No memory loss.  OBJ- Physical Exam General- Alert, Oriented, Affect-appropriate, Distress- none acute, wheelchair, Talkative off O2 Skin- rash-none, lesions- none, excoriation- none Lymphadenopathy- none Head- atraumatic            Eyes- Gross vision intact, PERRLA, conjunctivae and secretions  clear            Ears- Hearing, canals-normal            Nose- Clear, no-Septal dev, mucus, polyps, erosion, perforation             Throat- Mallampati II , mucosa clear , drainage- none, tonsils- atrophic. +Upper denture, missing                      lower teeth Neck- flexible , trachea midline, no stridor , thyroid nl, carotid no bruit Chest - symmetrical excursion , unlabored           Heart/CV- RRR , no murmur , no gallop  , no rub, nl s1 s2                           - JVD- none , edema- none, stasis changes- none, varices- none           Lung- clear to P&A, wheeze- none, cough- none , dullness-none, rub- none           Chest wall-  Abd-  Br/ Gen/ Rectal- Not done, not indicated Extrem- cyanosis- none, clubbing, none, atrophy- none, strength- nl Neuro- grossly intact to observation

## 2013-04-07 ENCOUNTER — Encounter (HOSPITAL_COMMUNITY): Payer: Self-pay | Admitting: Emergency Medicine

## 2013-04-07 ENCOUNTER — Emergency Department (HOSPITAL_COMMUNITY): Payer: Medicare Other

## 2013-04-07 ENCOUNTER — Telehealth: Payer: Self-pay | Admitting: Internal Medicine

## 2013-04-07 ENCOUNTER — Emergency Department (HOSPITAL_COMMUNITY)
Admission: EM | Admit: 2013-04-07 | Discharge: 2013-04-08 | Disposition: A | Discharge status: Other Acute Inpatient Hospital | Payer: Medicare Other | Attending: Emergency Medicine | Admitting: Emergency Medicine

## 2013-04-07 DIAGNOSIS — J4489 Other specified chronic obstructive pulmonary disease: Secondary | ICD-10-CM | POA: Insufficient documentation

## 2013-04-07 DIAGNOSIS — IMO0002 Reserved for concepts with insufficient information to code with codable children: Secondary | ICD-10-CM | POA: Insufficient documentation

## 2013-04-07 DIAGNOSIS — Z7982 Long term (current) use of aspirin: Secondary | ICD-10-CM | POA: Insufficient documentation

## 2013-04-07 DIAGNOSIS — F309 Manic episode, unspecified: Secondary | ICD-10-CM | POA: Insufficient documentation

## 2013-04-07 DIAGNOSIS — I2510 Atherosclerotic heart disease of native coronary artery without angina pectoris: Secondary | ICD-10-CM | POA: Insufficient documentation

## 2013-04-07 DIAGNOSIS — Z87448 Personal history of other diseases of urinary system: Secondary | ICD-10-CM | POA: Insufficient documentation

## 2013-04-07 DIAGNOSIS — E785 Hyperlipidemia, unspecified: Secondary | ICD-10-CM | POA: Insufficient documentation

## 2013-04-07 DIAGNOSIS — I5043 Acute on chronic combined systolic (congestive) and diastolic (congestive) heart failure: Secondary | ICD-10-CM | POA: Insufficient documentation

## 2013-04-07 DIAGNOSIS — Z880 Allergy status to penicillin: Secondary | ICD-10-CM | POA: Insufficient documentation

## 2013-04-07 DIAGNOSIS — Z862 Personal history of diseases of the blood and blood-forming organs and certain disorders involving the immune mechanism: Secondary | ICD-10-CM | POA: Insufficient documentation

## 2013-04-07 DIAGNOSIS — Z91040 Latex allergy status: Secondary | ICD-10-CM | POA: Insufficient documentation

## 2013-04-07 DIAGNOSIS — G479 Sleep disorder, unspecified: Secondary | ICD-10-CM | POA: Insufficient documentation

## 2013-04-07 DIAGNOSIS — E119 Type 2 diabetes mellitus without complications: Secondary | ICD-10-CM | POA: Insufficient documentation

## 2013-04-07 DIAGNOSIS — M1990 Unspecified osteoarthritis, unspecified site: Secondary | ICD-10-CM | POA: Insufficient documentation

## 2013-04-07 DIAGNOSIS — Z8742 Personal history of other diseases of the female genital tract: Secondary | ICD-10-CM | POA: Insufficient documentation

## 2013-04-07 DIAGNOSIS — I252 Old myocardial infarction: Secondary | ICD-10-CM | POA: Insufficient documentation

## 2013-04-07 DIAGNOSIS — Z79899 Other long term (current) drug therapy: Secondary | ICD-10-CM | POA: Insufficient documentation

## 2013-04-07 DIAGNOSIS — J449 Chronic obstructive pulmonary disease, unspecified: Secondary | ICD-10-CM | POA: Insufficient documentation

## 2013-04-07 DIAGNOSIS — I10 Essential (primary) hypertension: Secondary | ICD-10-CM | POA: Insufficient documentation

## 2013-04-07 DIAGNOSIS — Z87891 Personal history of nicotine dependence: Secondary | ICD-10-CM | POA: Insufficient documentation

## 2013-04-07 LAB — CBC
Hemoglobin: 13.4 g/dL (ref 12.0–15.0)
MCH: 28.6 pg (ref 26.0–34.0)
MCHC: 31.5 g/dL (ref 30.0–36.0)
Platelets: 224 10*3/uL (ref 150–400)
RBC: 4.68 MIL/uL (ref 3.87–5.11)
WBC: 8.6 10*3/uL (ref 4.0–10.5)

## 2013-04-07 LAB — COMPREHENSIVE METABOLIC PANEL
ALT: 15 U/L (ref 0–35)
AST: 31 U/L (ref 0–37)
Albumin: 3.1 g/dL — ABNORMAL LOW (ref 3.5–5.2)
Alkaline Phosphatase: 86 U/L (ref 39–117)
CO2: 19 mEq/L (ref 19–32)
Calcium: 10 mg/dL (ref 8.4–10.5)
Chloride: 99 mEq/L (ref 96–112)
GFR calc Af Amer: 19 mL/min — ABNORMAL LOW (ref >90)
GFR calc non Af Amer: 16 mL/min — ABNORMAL LOW (ref >90)
Glucose, Bld: 104 mg/dL — ABNORMAL HIGH (ref 70–99)
Potassium: 3.8 mEq/L (ref 3.5–5.1)
Sodium: 133 mEq/L — ABNORMAL LOW (ref 135–145)
Total Bilirubin: 0.3 mg/dL (ref 0.3–1.2)

## 2013-04-07 LAB — RAPID URINE DRUG SCREEN, HOSP PERFORMED
Barbiturates: NOT DETECTED
Benzodiazepines: NOT DETECTED
Tetrahydrocannabinol: NOT DETECTED

## 2013-04-07 MED ORDER — PANTOPRAZOLE SODIUM 40 MG PO TBEC: 80.0000 mg | DELAYED_RELEASE_TABLET | Freq: Every day | ORAL | Status: DC

## 2013-04-07 MED ORDER — ISOSORBIDE MONONITRATE ER 30 MG PO TB24
30.0000 mg | ORAL_TABLET | Freq: Every day | ORAL | Status: DC
  Administered 2013-04-08: 30 mg via ORAL
  Filled 2013-04-07: qty 1

## 2013-04-07 MED ORDER — ENSURE COMPLETE PO LIQD
237.0000 mL | Freq: Two times a day (BID) | ORAL | Status: DC
  Administered 2013-04-07 – 2013-04-08 (×2): 237 mL via ORAL
  Filled 2013-04-07 (×3): qty 237

## 2013-04-07 MED ORDER — HYDRALAZINE HCL 25 MG PO TABS
25.0000 mg | ORAL_TABLET | Freq: Two times a day (BID) | ORAL | Status: DC
  Administered 2013-04-07 – 2013-04-08 (×2): 25 mg via ORAL
  Filled 2013-04-07 (×3): qty 1

## 2013-04-07 MED ORDER — DILTIAZEM HCL ER COATED BEADS 120 MG PO CP24
120.0000 mg | ORAL_CAPSULE | Freq: Every day | ORAL | Status: DC
  Administered 2013-04-08: 120 mg via ORAL
  Filled 2013-04-07: qty 1

## 2013-04-07 MED ORDER — ACETAMINOPHEN 325 MG PO TABS: 650.0000 mg | ORAL_TABLET | ORAL | Status: DC | PRN

## 2013-04-07 MED ORDER — ONDANSETRON HCL 4 MG PO TABS: 4.0000 mg | ORAL_TABLET | Freq: Three times a day (TID) | ORAL | Status: DC | PRN

## 2013-04-07 MED ORDER — LORAZEPAM 1 MG PO TABS: 1.0000 mg | ORAL_TABLET | Freq: Three times a day (TID) | ORAL | Status: DC | PRN

## 2013-04-07 NOTE — ED Notes (Signed)
Pt's family states that pt was seen 4 years ago for dementia/bipolar issues at [facility] and was stable. States recently she has gone from taking 17 of her meds to taking 4 of them and states that she doesn't need them now.

## 2013-04-07 NOTE — BH Assessment (Signed)
Assessment Note   [name] is an 72 y.o. female who presents with her family for evaluation for increasing out of character behavior, mood swings, pressured speech, and paranoia.  Her family reports that for the last 2 mos [name] has been adjusting her own medications.  She is to take 15-17 pills a day and is down to 5.  There are some supplements she has decided to eliminate and told them that others needed to be taken at other times or caused her to use the bathroom too much, etc.  They are concerned that this behavior is caused by her mental status or that the medication changes have caused her current mental state.  Upon assessment, [name] reports she is upset that her family brought her to the ED and if her husband wants her to stay at the hospital for evaluation, he might as well sign divorce papers because she won't stay with him.  She has pressured, tangential speech and tells this Clinical research associate stories of criminals in their neighborhood and the ways she fears for her life.  She has been waking up very early in the morning between 4:30 and 6 and starts calling everyone they know.  She's had communication cut off by her best friend because she called the sheriff to her house to have her checked on because [name] was convinced that the woman's daughter in-law had killed her.  When asked why she thought this she describes in great detail about that she noticed a pepsi crate had been moved on the porch, the daughter in-law answered the phone when she wasn't allowed too, and because she had a gut feeling, and had made an anonymous tip about it, that the woman's son had actually murdered another family in town several years prior.    He reports that she bosses him around all day and he tries not to make her angry because she flies off the handle, but that all she wants to do is spend money and "go go go."  They live on a fixed income and he's concerned because when he has tried to reign in  her spending, she borrows from other people.  Today, she was insistent that they go to the bank and get 1,000 dollars to buy new cell phones.  [name] is not concerned about the money because she had a rich uncle die and thinks another will die soon, and expects to get the financial reward associated with the anonymous tip about her neighbor's son.    Her daughter explains that she has called the sheriff several times and that they told her if she does it again, she will be arrested.  She is very concerned about crimes in their neighborhood that aren't really happening and recently left a concerning voicemail on her son's phone stating, "before y'all get together to send me to [place], I'm figuring if I was going to shoot myself or shoot you all."  Her son reports this was before they had even broached the subject of hospitalization with her, but that his children heard the message and are very concerned.  [name]'s daughter also said that for 3 years their mother didn't want to go anywhere and has had a sudden burst of energy.  She gets an idea in her head and is obsessive about it, she talks non stop, and cannot sit still.  She is barely sleeping or eating and is very suspicious of everyone.  Throughout the assessment, [name]  continues to express concern over the ways her family does not give her the respect she deserves.  [name] was hospitalized in August of 2010 for some similar behaviors and was discharged with a diagnosis of bipolar disorder.  She was followed for several months by Carole Binning on an outpatient basis and her meds were eventually titrated off.  The psychiatrist ultimately stated she did not believe [name] to have bipolar disorder, but rather that the issue originally stemmed from medications.  She currently denies SI, HI, or consistent AVH.  She does admit to recently seeing a neighbor's dog outside when he wasn't there and thought she saw someone in  bed next to her husband, but knows he wasn't really there and says maybe it was God there to help her because she was in pain at the moment.  She denies any drug or alcohol use.  [name] is appropriate for inpatient treatment for crisis management.  Referrals will be sent to geropsych hospitals for placement.   Axis I: Bipolar, Manic Axis II: Deferred Axis III:  Past Medical History  Diagnosis Date  . Osteoarthritis   . Pelvic inflammatory disease   . 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 MI (myocardial infarction)   . HTN (hypertension)   . Hyperlipidemia   . Hypothyroidism   . Anemia   . Lung nodule   . COPD (chronic obstructive pulmonary disease)   . Renal insufficiency   . DM (diabetes mellitus)   . CAD (coronary artery disease)    Axis IV: economic problems, problems with access to health care services and problems with primary support group Axis V: 21-30 behavior considerably influenced by delusions or hallucinations OR serious impairment in judgment, communication OR inability to function in almost all areas  Past Medical History:  Past Medical History  Diagnosis Date  . Osteoarthritis   . Pelvic inflammatory disease   . 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 MI (myocardial infarction)   . HTN (hypertension)   . Hyperlipidemia   . Hypothyroidism   . Anemia   . Lung nodule   . COPD (chronic obstructive pulmonary disease)   . Renal insufficiency   . DM (diabetes mellitus)   . CAD (coronary artery disease)     Past Surgical History  Procedure Laterality Date  . Back surgery    . Tubal ligation    . Mandible fracture surgery      Family History:  Family History  Problem Relation Age of Onset  . Emphysema Sister   . Heart failure Sister   . Lung cancer Father   . Cancer Mother     glioblastoma    Social History:  reports that she has quit smoking. She does not have any smokeless tobacco history on file.  She reports that she does not drink alcohol. Her drug history is not on file.  Additional Social History:  Alcohol / Drug Use History of alcohol / drug use?: No history of alcohol / drug abuse  CIWA: CIWA-Ar BP: 129/100 mmHg Pulse Rate: 92 COWS:    Allergies:  Allergies  Allergen Reactions  . Allopurinol   . Amoxicillin     REACTION: specific fose unspecified  . Ciprofloxacin     REACTION: specific dose unspecified  . Iohexol      Code: HIVES, Desc: ALLERGY PER PRIOR REPORT PT DOES NOT REMEMBER REACTION--SHE REMEMBERS "CODE BLUE"//A.C., Onset Date: [id number]   . Ivp Dye [Iodinated  Diagnostic Agents] Other (See Comments)    "shut my kidneys down"  . Latex   . Metronidazole   . Sulfamethoxazole-Trimethoprim      Home Medications:  (Not in a hospital admission)  OB/GYN Status:  No LMP recorded. Patient is postmenopausal.  General Assessment Data Location of Assessment: [facility] Is this a Tele or Face-to-Face Assessment?: Face-to-Face Is this an Initial Assessment or a Re-assessment for this encounter?: Initial Assessment Living Arrangements: Spouse/significant other Can pt return to current living arrangement?: Yes Admission Status: Voluntary Is patient capable of signing voluntary admission?: No Transfer from: Acute Hospital Referral Source: Self/Family/Friend     [facility] Crisis Care Plan Living Arrangements: Spouse/significant other  Education Status Is patient currently in school?: No  Risk to self Suicidal Ideation: No Suicidal Intent: No Is patient at risk for suicide?: No Suicidal Plan?: No Access to Means: No Previous Attempts/Gestures: No Intentional Self Injurious Behavior: None Family Suicide History: No Recent stressful life event(s): Conflict (Comment);Turmoil (Comment);Financial Problems Persecutory voices/beliefs?: No Depression: Yes Depression Symptoms: Feeling angry/irritable;Feeling worthless/self pity;Insomnia Substance abuse history and/or  treatment for substance abuse?: No Suicide prevention information given to non-admitted patients: Not applicable  Risk to Others Homicidal Ideation: No Thoughts of Harm to Others: No Current Homicidal Intent: No Current Homicidal Plan: No Access to Homicidal Means: No History of harm to others?: No Assessment of Violence: None Noted Does patient have access to weapons?: No Criminal Charges Pending?: No Does patient have a court date: No  Psychosis Hallucinations: Visual (Saw a dog that wasn't there, saw a man on other side of husb) Delusions: Persecutory;Grandiose  Mental Status Report Appear/Hygiene: Disheveled Eye Contact: Good Motor Activity: Freedom of movement Speech: Pressured;Tangential Level of Consciousness: Alert;Irritable Mood: Labile;Preoccupied Affect: Irritable Anxiety Level: Moderate Thought Processes: Tangential;Circumstantial Judgement: Impaired Orientation: Person;Place;Situation;Time Obsessive Compulsive Thoughts/Behaviors: Severe  Cognitive Functioning Concentration: Decreased Memory: Recent Impaired;Remote Intact IQ: Average Insight: Poor Impulse Control: Poor Appetite: Good Weight Loss: 0 Weight Gain: 0 Sleep: Decreased Total Hours of Sleep: 4 Vegetative Symptoms: None  ADLScreening [facility] Assessment Services) Patient's cognitive ability adequate to safely complete daily activities?: Yes Patient able to express need for assistance with ADLs?: Yes Independently performs ADLs?: Yes (appropriate for developmental age)  Prior Inpatient Therapy Prior Inpatient Therapy: Yes Prior Therapy Dates: 2010 Prior Therapy Facilty/Provider(s): [place] Reason for Treatment: Mania  Prior Outpatient Therapy Prior Outpatient Therapy: Yes Prior Therapy Dates: 2010-6 mos Prior Therapy Facilty/Provider(s): [facility] Reason for Treatment: Mania, med adjustment  ADL Screening (condition at time of admission) Patient's cognitive ability adequate  to safely complete daily activities?: Yes Patient able to express need for assistance with ADLs?: Yes Independently performs ADLs?: Yes (appropriate for developmental age)       Abuse/Neglect Assessment (Assessment to be complete while patient is alone) Physical Abuse: Denies Verbal Abuse: Denies Sexual Abuse: Denies Exploitation of patient/patient's resources: Denies Values / Beliefs Cultural Requests During Hospitalization: None Spiritual Requests During Hospitalization: None   Advance Directives (For Healthcare) Advance Directive: Patient does not have advance directive Pre-existing out of facility DNR order (yellow form or pink MOST form): No Nutrition Screen- [facility] Adult/[facility] Patient's home diet: Regular  Additional Information 1:1 In Past 12 Months?: No CIRT Risk: No Elopement Risk: No Does patient have medical clearance?: Yes  Child/Adolescent Assessment Running Away Risk: Denies  Disposition:  Disposition Initial Assessment Completed for this Encounter: Yes Disposition of Patient: Inpatient treatment program Type of inpatient treatment program: Adult  On Site Evaluation by:   Reviewed with  Physician:    Steward Ros 04/07/2013 8:52 PM

## 2013-04-07 NOTE — ED Provider Notes (Addendum)
CSN: [id number]     Arrival date & time 04/07/13  1528 History   First MD Initiated Contact with Patient 04/07/13 1555     Chief Complaint  Patient presents with  . Medical Clearance   (Consider location/radiation/quality/duration/timing/severity/associated sxs/prior Treatment) HPI Comments: Pt brought to the ER by family. Pt has hx of CAD, DM, COPD, Depression. Family reports that patient decided to wean herself off of her psych meds last year, and has been gradually getting worse. Pt now has been staying up all night, calling everyone that she knows, calling police, threatening to kill her son and others. Pt lives with her husband, who no longer is able to take care of her, and the fmaily decided to collectively bring her to the ER. She has been in [place] before. Pt denies any SI/HI/halucunations. Pt denies nausea, emesis, fevers, chills, chest pains, shortness of breath, headaches, abdominal pain, uti like symptoms.   The history is provided by the patient.    Past Medical History  Diagnosis Date  . Osteoarthritis   . Pelvic inflammatory disease   . 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 MI (myocardial infarction)   . HTN (hypertension)   . Hyperlipidemia   . Hypothyroidism   . Anemia   . Lung nodule   . COPD (chronic obstructive pulmonary disease)   . Renal insufficiency   . DM (diabetes mellitus)   . CAD (coronary artery disease)    Past Surgical History  Procedure Laterality Date  . Back surgery    . Tubal ligation    . Mandible fracture surgery     Family History  Problem Relation Age of Onset  . Emphysema Sister   . Heart failure Sister   . Lung cancer Father   . Cancer Mother     glioblastoma   History  Substance Use Topics  . Smoking status: Former Games developer  . Smokeless tobacco: Not on file  . Alcohol Use: No   OB History   Grav Para Term Preterm Abortions TAB SAB Ect Mult Living                 Review of Systems   Constitutional: Negative for activity change.  Respiratory: Negative for shortness of breath.   Cardiovascular: Negative for chest pain.  Gastrointestinal: Negative for nausea, vomiting and abdominal pain.  Genitourinary: Negative for dysuria.  Musculoskeletal: Negative for neck pain.  Neurological: Negative for headaches.  Psychiatric/Behavioral: Positive for sleep disturbance and agitation. Negative for suicidal ideas and hallucinations.    Allergies  Allopurinol; Amoxicillin; Ciprofloxacin; Iohexol; Ivp dye; Latex; Metronidazole; and Sulfamethoxazole-trimethoprim  Home Medications   Current Outpatient Rx  Name  Route  Sig  Dispense  Refill  . acetaminophen (TYLENOL) 325 MG tablet   Oral   Take 650 mg by mouth every 6 (six) hours as needed for pain.          Marland Kitchen albuterol (VENTOLIN HFA) 108 (90 BASE) MCG/ACT inhaler   Inhalation   Inhale 2 puffs into the lungs every 6 (six) hours as needed.   1 Inhaler   prn   . aspirin 325 MG EC tablet   Oral   Take 325 mg by mouth daily.           Marland Kitchen diltiazem (CARDIZEM CD) 120 MG 24 hr capsule   Oral   Take 120 mg by mouth daily.           . hydrALAZINE (APRESOLINE) 25 MG tablet  Oral   Take 1 tablet (25 mg total) by mouth 2 (two) times daily.   180 tablet   3   . isosorbide mononitrate (IMDUR) 30 MG 24 hr tablet   Oral   Take 30 mg by mouth daily.           Marland Kitchen omega-3 acid ethyl esters (LOVAZA) 1 G capsule   Oral   Take 2 g by mouth daily.          . pantoprazole (PROTONIX) 40 MG tablet   Oral   Take 80 mg by mouth daily.           BP 129/100  Pulse 92  Temp(Src) 98.2 F (36.8 C) (Oral)  Resp 18  SpO2 97% Physical Exam  Nursing note and vitals reviewed. Constitutional: She is oriented to person, place, and time. She appears well-developed and well-nourished.  HENT:  Head: Normocephalic and atraumatic.  Eyes: EOM are normal. Pupils are equal, round, and reactive to light.  Neck: Neck supple.   Cardiovascular: Normal rate, regular rhythm and normal heart sounds.   No murmur heard. Pulmonary/Chest: Effort normal. No respiratory distress.  Abdominal: Soft. She exhibits no distension. There is no tenderness. There is no rebound and no guarding.  Neurological: She is alert and oriented to person, place, and time.  Skin: Skin is warm and dry.    ED Course  Procedures (including critical care time) Labs Review Labs Reviewed  COMPREHENSIVE METABOLIC PANEL - Abnormal; Notable for the following:    Sodium 133 (*)    Glucose, Bld 104 (*)    BUN 43 (*)    Creatinine, Ser 2.74 (*)    Albumin 3.1 (*)    GFR calc non Af Amer 16 (*)    GFR calc Af Amer 19 (*)    All other components within normal limits  SALICYLATE LEVEL - Abnormal; Notable for the following:    Salicylate Lvl <2.0 (*)    All other components within normal limits  ACETAMINOPHEN LEVEL  CBC  ETHANOL  URINE RAPID DRUG SCREEN (HOSP PERFORMED)   Imaging Review Dg Chest Port 1 View  04/07/2013   CLINICAL DATA:  Medical clearance. History of hypertension.  EXAM: PORTABLE CHEST - 1 VIEW  COMPARISON:  CHEST x-ray 03/29/2013. Chest CT 04/01/2013.  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 No definite new acute consolidative airspace disease. No pleural effusions. No evidence of pulmonary edema. Heart size is normal. Mediastinal contours are within normal limits. Atherosclerosis in the thoracic aorta.  IMPRESSION: 1. 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 2. Atherosclerosis.   Electronically Signed   By: Trudie Reed M.D.   On: 04/07/2013 17:12    EKG Interpretation     Ventricular Rate:  77 PR Interval:  180 QRS Duration: 110 QT Interval:  387 QTC Calculation: 438 R Axis:   -67 Text Interpretation:  Sinus rhythm Incomplete RBBB and LAFB Consider right ventricular hypertrophy No significant change since last tracing            MDM  No diagnosis found.  Pt comes in with cc of behavioral problems. Appears that she has been having some manic episodes, and is unstable. Refusing point blank to be admitted. Family not feelign safe taking care of her  Derwood Kaplan, MD 04/09/13 4098  Derwood Kaplan, MD 04/09/13 [phone]

## 2013-04-07 NOTE — ED Notes (Signed)
Pt has stopped taking her meds and the last 6 months she not taking care of herself and has called the cops multiple times in last several weeks bc she is scared.  Pt is very aggressive  Toward her husband who is caretaker. Pt threatening to kill herself. Pt has dementia and bipolar. Family trying to not take IVC paperwork out on her.

## 2013-04-07 NOTE — Progress Notes (Signed)
CSW received call from Pamala Duffel at [facility].  Pt accepted for placement by Dr. Dione Housekeeper.  Nurse report can be called into [phone].  Daytime CSW/TTS will follow up in the am to coordinate transport.  CSW informed Marchelle Folks of TTS.  Per Marchelle Folks she would check to see if pt had been served IVC orders and fax that over to [facility] if orders were in pts file.  Marva Panda, LCSWA  [phone]  .04/07/2013  11:00 pm

## 2013-04-07 NOTE — Progress Notes (Signed)
CSW faxed IVC paperwork to night magistrate and confirmed receipt with [employer].  Marva Panda, Theresia Majors  [phone]  .04/07/2013  9:35 pm

## 2013-04-07 NOTE — ED Notes (Signed)
Patient's family took 1 bag of belonging to car. Patient has wheelchair pad(refused to let that go to the car), sweater vest(on- stated she was "freezing"), patient has shoes in room, will not wear skid socks- states she has neuropathy in feet, also has pocketbook- would like it locked up. RN Vernona Rieger notified of all of the above info

## 2013-04-07 NOTE — ED Notes (Signed)
Pt unable to walk without a walker so cannot be moved to psych ED.

## 2013-04-07 NOTE — Progress Notes (Addendum)
CSW consulted by TTS to start placement search for pt.  [facility] no beds available. [facility] no beds available  Faxed information to [place].   Review pending. Faxed information to [facility] .  Review pending. Faxed information to [facility]. Review pending.  Marva Panda, LCSWA  [phone] .04/07/2013 10:00 pm    Addendum Pt declined at [facility] due to medical acuity.  Catha Gosselin, LCSW [phone]  ED CSW .04/08/2013 742am

## 2013-04-07 NOTE — Telephone Encounter (Signed)
I spoke with [name]. She reports they are seeing pt in the ED. She told them she is only taking 2 medications. The family is stating she is on more than 2 medications. They wanted to get our list that we have. I advised [name] and she will also call pt's pharmacy as well. Nothing further needed

## 2013-04-08 ENCOUNTER — Encounter (HOSPITAL_COMMUNITY): Payer: Self-pay | Admitting: Registered Nurse

## 2013-04-08 NOTE — ED Notes (Signed)
Per Night Shift RN, report can not be called to [facility] until after 8am.  Family has remained at beside throughout the night, due to Pt's needs for assistance.

## 2013-04-08 NOTE — ED Notes (Signed)
Bed: WA20 Expected date:  Expected time:  Means of arrival:  Comments: Room 4 

## 2013-04-08 NOTE — ED Notes (Addendum)
Spoke w/ [employer].  They should be here between 9-9:30.  Pt is O2 dependent and PTAR will be called.

## 2013-04-08 NOTE — BH Assessment (Signed)
[facility] Assessment Progress Note   Marchelle Folks reports that [facility] transport has been requested for AM transportation to [place].  All IVC papers faxed to [facility].  Family has been notified of disposition.  Amanda left a voice-mail for [facility] for the transport.

## 2013-04-08 NOTE — ED Notes (Signed)
Pt's shoes, hair brush and denture paste transported with Pt/PTAR.

## 2013-04-23 NOTE — Assessment & Plan Note (Signed)
Controlled. Emphysema> bronchitis component. Good control

## 2013-04-23 NOTE — Assessment & Plan Note (Signed)
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 Plan-if she produces more sputum we can culture. Meanwhile plan on followup chest CT in 4 months looking for progression

## 2013-04-23 NOTE — Assessment & Plan Note (Signed)
Controlled by cardiology

## 2013-04-26 ENCOUNTER — Encounter: Payer: Self-pay | Admitting: Cardiology

## 2013-05-12 ENCOUNTER — Telehealth: Payer: Self-pay | Admitting: Cardiology

## 2013-05-12 DIAGNOSIS — I2510 Atherosclerotic heart disease of native coronary artery without angina pectoris: Secondary | ICD-10-CM

## 2013-05-12 DIAGNOSIS — N259 Disorder resulting from impaired renal tubular function, unspecified: Secondary | ICD-10-CM

## 2013-05-12 DIAGNOSIS — I10 Essential (primary) hypertension: Secondary | ICD-10-CM

## 2013-05-12 MED ORDER — HYDRALAZINE HCL 25 MG PO TABS: 25.0000 mg | ORAL_TABLET | Freq: Three times a day (TID) | ORAL | Status: AC

## 2013-05-12 NOTE — Telephone Encounter (Signed)
Spoke with pt dtr, the pt was recently taken to [facility] for some behavioral issues. While there they stopped her hydralazine and started her on metoprolol. Since she has returned home she has stopped the metoprolol and restarted the hydralazine at TID instead of BID. dtr wants to make sure it is okay for her to take TID. Explained it is okay but they need to track her bp and make sure it is not getting too low. dtr voiced understanding. She will call with further questions or concerns.

## 2013-05-12 NOTE — Telephone Encounter (Signed)
New Problem:  Pt's daughter is calling wanting to discuss the patient's medications. She would like to speak with the nurse. States she believes her mom may be taking too much or too little of some of her meds.

## 2013-05-13 ENCOUNTER — Encounter (HOSPITAL_COMMUNITY): Payer: Self-pay | Admitting: Emergency Medicine

## 2013-05-13 ENCOUNTER — Emergency Department (HOSPITAL_COMMUNITY)
Admission: EM | Admit: 2013-05-13 | Discharge: 2013-05-14 | Disposition: A | Discharge status: Home / Self Care | Payer: Medicare Other | Attending: Emergency Medicine | Admitting: Emergency Medicine

## 2013-05-13 DIAGNOSIS — Z87891 Personal history of nicotine dependence: Secondary | ICD-10-CM | POA: Insufficient documentation

## 2013-05-13 DIAGNOSIS — Z79899 Other long term (current) drug therapy: Secondary | ICD-10-CM | POA: Insufficient documentation

## 2013-05-13 DIAGNOSIS — I2510 Atherosclerotic heart disease of native coronary artery without angina pectoris: Secondary | ICD-10-CM | POA: Insufficient documentation

## 2013-05-13 DIAGNOSIS — I10 Essential (primary) hypertension: Secondary | ICD-10-CM | POA: Insufficient documentation

## 2013-05-13 DIAGNOSIS — J4489 Other specified chronic obstructive pulmonary disease: Secondary | ICD-10-CM | POA: Insufficient documentation

## 2013-05-13 DIAGNOSIS — E119 Type 2 diabetes mellitus without complications: Secondary | ICD-10-CM | POA: Insufficient documentation

## 2013-05-13 DIAGNOSIS — Z87448 Personal history of other diseases of urinary system: Secondary | ICD-10-CM | POA: Insufficient documentation

## 2013-05-13 DIAGNOSIS — F419 Anxiety disorder, unspecified: Secondary | ICD-10-CM

## 2013-05-13 DIAGNOSIS — Z91040 Latex allergy status: Secondary | ICD-10-CM | POA: Insufficient documentation

## 2013-05-13 DIAGNOSIS — Z7982 Long term (current) use of aspirin: Secondary | ICD-10-CM | POA: Insufficient documentation

## 2013-05-13 DIAGNOSIS — F411 Generalized anxiety disorder: Secondary | ICD-10-CM | POA: Insufficient documentation

## 2013-05-13 DIAGNOSIS — E785 Hyperlipidemia, unspecified: Secondary | ICD-10-CM | POA: Insufficient documentation

## 2013-05-13 DIAGNOSIS — J449 Chronic obstructive pulmonary disease, unspecified: Secondary | ICD-10-CM | POA: Insufficient documentation

## 2013-05-13 DIAGNOSIS — M1990 Unspecified osteoarthritis, unspecified site: Secondary | ICD-10-CM | POA: Insufficient documentation

## 2013-05-13 DIAGNOSIS — I252 Old myocardial infarction: Secondary | ICD-10-CM | POA: Insufficient documentation

## 2013-05-13 DIAGNOSIS — Z8742 Personal history of other diseases of the female genital tract: Secondary | ICD-10-CM | POA: Insufficient documentation

## 2013-05-13 DIAGNOSIS — Z862 Personal history of diseases of the blood and blood-forming organs and certain disorders involving the immune mechanism: Secondary | ICD-10-CM | POA: Insufficient documentation

## 2013-05-13 DIAGNOSIS — Z880 Allergy status to penicillin: Secondary | ICD-10-CM | POA: Insufficient documentation

## 2013-05-13 DIAGNOSIS — I5040 Unspecified combined systolic (congestive) and diastolic (congestive) heart failure: Secondary | ICD-10-CM | POA: Insufficient documentation

## 2013-05-13 LAB — URINALYSIS, ROUTINE W REFLEX MICROSCOPIC
Glucose, UA: NEGATIVE mg/dL
Hgb urine dipstick: NEGATIVE
Ketones, ur: NEGATIVE mg/dL
Leukocytes, UA: NEGATIVE
Nitrite: NEGATIVE
Protein, ur: 100 mg/dL — AB
Specific Gravity, Urine: 1.016 (ref 1.005–1.030)

## 2013-05-13 LAB — URINE MICROSCOPIC-ADD ON

## 2013-05-13 LAB — BASIC METABOLIC PANEL
CO2: 23 mEq/L (ref 19–32)
Calcium: 8.9 mg/dL (ref 8.4–10.5)
Chloride: 101 mEq/L (ref 96–112)
Creatinine, Ser: 3 mg/dL — ABNORMAL HIGH (ref 0.50–1.10)
GFR calc Af Amer: 17 mL/min — ABNORMAL LOW (ref >90)
Sodium: 136 mEq/L (ref 135–145)

## 2013-05-13 LAB — CBC
MCH: 27.9 pg (ref 26.0–34.0)
MCV: 88.5 fL (ref 78.0–100.0)
Platelets: 265 10*3/uL (ref 150–400)
RDW: 14.6 % (ref 11.5–15.5)
WBC: 7.7 10*3/uL (ref 4.0–10.5)

## 2013-05-13 LAB — OCCULT BLOOD, POC DEVICE: Fecal Occult Bld: NEGATIVE

## 2013-05-13 LAB — POCT I-STAT TROPONIN I: Troponin i, poc: 0.01 ng/mL (ref 0.00–0.08)

## 2013-05-13 NOTE — ED Provider Notes (Signed)
CSN: [id number]     Arrival date & time 05/13/13  2132 History   First MD Initiated Contact with Patient 05/13/13 2139     Chief Complaint  Patient presents with  . Anxiety   (Consider location/radiation/quality/duration/timing/severity/associated sxs/prior Treatment) Patient is a 72 y.o. female presenting with anxiety. The history is provided by the patient.  Anxiety This is a new problem. The current episode started 6 to 12 hours ago. The problem occurs constantly. The problem has not changed since onset.Pertinent negatives include no chest pain, no abdominal pain and no shortness of breath. Nothing aggravates the symptoms. Nothing relieves the symptoms.    Past Medical History  Diagnosis Date  . Osteoarthritis   . Pelvic inflammatory disease   . 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 MI (myocardial infarction)   . HTN (hypertension)   . Hyperlipidemia   . Hypothyroidism   . Anemia   . Lung nodule   . COPD (chronic obstructive pulmonary disease)   . Renal insufficiency   . DM (diabetes mellitus)   . CAD (coronary artery disease)    Past Surgical History  Procedure Laterality Date  . Back surgery    . Tubal ligation    . Mandible fracture surgery     Family History  Problem Relation Age of Onset  . Emphysema Sister   . Heart failure Sister   . Lung cancer Father   . Cancer Mother     glioblastoma   History  Substance Use Topics  . Smoking status: Former Games developer  . Smokeless tobacco: Not on file  . Alcohol Use: No   OB History   Grav Para Term Preterm Abortions TAB SAB Ect Mult Living                 Review of Systems  Constitutional: Negative for fever and chills.  Respiratory: Negative for cough and shortness of breath.   Cardiovascular: Negative for chest pain and leg swelling.  Gastrointestinal: Negative for nausea, vomiting, abdominal pain and diarrhea.  All other systems reviewed and are negative.    Allergies  Allopurinol;  Amoxicillin; Ciprofloxacin; Iohexol; Ivp dye; Latex; Metronidazole; and Sulfamethoxazole-trimethoprim  Home Medications   Current Outpatient Rx  Name  Route  Sig  Dispense  Refill  . acetaminophen (TYLENOL) 325 MG tablet   Oral   Take 650 mg by mouth every 6 (six) hours as needed for pain.          Marland Kitchen aspirin 325 MG EC tablet   Oral   Take 325 mg by mouth daily.           Marland Kitchen diltiazem (CARDIZEM CD) 120 MG 24 hr capsule   Oral   Take 120 mg by mouth daily.           . hydrALAZINE (APRESOLINE) 25 MG tablet   Oral   Take 1 tablet (25 mg total) by mouth 3 (three) times daily.   180 tablet   3   . isosorbide mononitrate (IMDUR) 30 MG 24 hr tablet   Oral   Take 30 mg by mouth daily.           Marland Kitchen omega-3 acid ethyl esters (LOVAZA) 1 G capsule   Oral   Take 2 g by mouth daily.          . pantoprazole (PROTONIX) 40 MG tablet   Oral   Take 80 mg by mouth daily.  BP 154/59  Pulse 98  Temp(Src) 98.1 F (36.7 C) (Oral)  Resp 20  SpO2 93% Physical Exam  Nursing note and vitals reviewed. Constitutional: She is oriented to person, place, and time. She appears well-developed and well-nourished. No distress.  Anxious   HENT:  Head: Normocephalic and atraumatic.  Eyes: EOM are normal. Pupils are equal, round, and reactive to light.  Neck: Normal range of motion. Neck supple.  Cardiovascular: Normal rate and regular rhythm.  Exam reveals no friction rub.   No murmur heard. Pulmonary/Chest: Effort normal and breath sounds normal. No respiratory distress. She has no wheezes. She has no rales.  Abdominal: Soft. She exhibits no distension. There is no tenderness. There is no rebound.  Musculoskeletal: Normal range of motion. She exhibits no edema.  Neurological: She is alert and oriented to person, place, and time. No cranial nerve deficit. She exhibits normal muscle tone. Coordination normal.  Skin: She is not diaphoretic.    ED Course  Procedures (including  critical care time) Labs Review Labs Reviewed  CBC - Abnormal; Notable for the following:    RBC 3.65 (*)    Hemoglobin 10.2 (*)    HCT 32.3 (*)    All other components within normal limits  BASIC METABOLIC PANEL - Abnormal; Notable for the following:    Potassium 3.4 (*)    Glucose, Bld 114 (*)    Creatinine, Ser 3.00 (*)    GFR calc non Af Amer 14 (*)    GFR calc Af Amer 17 (*)    All other components within normal limits  URINALYSIS, ROUTINE W REFLEX MICROSCOPIC  POCT I-STAT TROPONIN I   Imaging Review No results found.  EKG Interpretation    Date/Time:  Thursday May 13 2013 22:21:02 EST Ventricular Rate:  90 PR Interval:  180 QRS Duration: 107 QT Interval:  376 QTC Calculation: 460 R Axis:   -73 Text Interpretation:  Sinus rhythm Left anterior fascicular block Abnormal R-wave progression, early transition Confirmed by Gwendolyn Grant  MD, Jacarius Handel ([id number]) on 05/13/2013 10:34:04 PM            MDM   1. Anxiety    72 year old female presents with anxiety. Today's Thanksgiving. Patient's family was trying to get the upper pole of the go to her house. Patient states that they're trying to put her nursing home she is giving everything away. She really doesn't understand why she is here currently. She does state she feels very stressed out. Patient's husband is here concerned about her overall well-being. She's had decreased by mouth intake today. Husband and daughter also state patient has not been taking her meds for the past 2 months.  Patient here denies any chest pain, shortness of breath, abdominal pain, nausea, fever, vomiting. Patient seems very anxious. Her vitals are stable here. We will check basic labs and EKG. Labs show Hgb drop, hemoccult negative. CKD redemonstrated here. Patient is feeling better. Family is here telling me that they have arranged for her to go to the nursing home tomorrow for her increased "scatterbrainedness." The patient was unaware of this.  They're asking for me to admit her so she does recommend the nursing home I informed him I could not do that as her labs are normal she has no medical basis for admission. Patient is to see her doctor in the morning prior to going to the nursing home. Stable for discharge.   Dagmar Hait, MD 05/13/13 [phone]

## 2013-05-13 NOTE — ED Notes (Signed)
Per EMS pt suppose to go to nursing home tomorrow. Pt anxious and states family fighting over nursing home placement. Pt is alert and oriented.

## 2013-05-19 ENCOUNTER — Non-Acute Institutional Stay (SKILLED_NURSING_FACILITY): Payer: Medicare Other | Admitting: Internal Medicine

## 2013-05-19 DIAGNOSIS — E785 Hyperlipidemia, unspecified: Secondary | ICD-10-CM

## 2013-05-19 DIAGNOSIS — I5040 Unspecified combined systolic (congestive) and diastolic (congestive) heart failure: Secondary | ICD-10-CM

## 2013-05-19 DIAGNOSIS — I10 Essential (primary) hypertension: Secondary | ICD-10-CM

## 2013-05-19 DIAGNOSIS — I2510 Atherosclerotic heart disease of native coronary artery without angina pectoris: Secondary | ICD-10-CM

## 2013-05-19 DIAGNOSIS — F319 Bipolar disorder, unspecified: Secondary | ICD-10-CM

## 2013-05-19 DIAGNOSIS — E039 Hypothyroidism, unspecified: Secondary | ICD-10-CM

## 2013-05-19 DIAGNOSIS — N259 Disorder resulting from impaired renal tubular function, unspecified: Secondary | ICD-10-CM

## 2013-05-19 DIAGNOSIS — J441 Chronic obstructive pulmonary disease with (acute) exacerbation: Secondary | ICD-10-CM

## 2013-05-19 NOTE — Progress Notes (Signed)
Patient ID: [name], female   DOB: [birth year], 72 y.o.   MRN: [id number]  This is an acute visit.  Level of care skilled.  [facility].  Chief complaint-acute visit status post admission to nursing facility secondary to apparently increased care needs.  History of present illness.  Patient is a 72 year old female who has been admitted to this facility after apparently a complex preadmission course.  It appears back in late October early November she was hospitalized at [facility] behavioral health for apparent manic symptoms at home-she had lived at home with her husband.  Apparently she was delusional and at times called law-enforcement.   her stay was quite complex with numerous medication trials  But apparently eventually her behavior did improve as well as her disposition-she was discharged on low-dose Aricept Tegretol Risperdal 0.5 mg each bedtime-she was discharged back home with her husband.  Apparently however her postop hospitalization course continued to be challenging-apparently she was again refusing to take her medications-from the notes I see she apparently wastrying to give things away and family who wanted her admitted to a nursing home  She did have an ER visit on Thanksgiving day where an EKG as well as blood work was done which did not show anything acutely changed-- she does have significant renal issues creatinine was 3.0 and apparently this is relatively baseline-apparently status post ER visit she did visit her primary care provider and from there she was admitted to this facility  Her stay here has been relatively uneventful that she has no acute complaints today nursing staff says she is cooperative in taking her medications  She actually has no complaints other than that she does not like her to read diet-her blood sugars is also been obtained 4 times a day secondary to being a new admission apparently with some history of hyperglycemia in the past  however these appear to be quite stable -range from 98 to low 100s and hemoglobin A1c is 5.9.  Previous medical history.  History of bipolar disorder.  COPD.  Chronic kidney disease. Carotid artery stenosis.  Hyperlipidemia.  Hypertension.  Coronary artery disease status post non-ST MI.  Combined systolic and diastolic CHF.  Hypothyroidism.  Osteoarthritis.  Pelvic inflammatory disease.    Previous surgeries.  Back surgery.  Tubal ligation.  Mandible fracture surgery  Social history-she has lived with her husband-he is a former smoker no history of alcohol use.  Family history.  Father with a history of lung cancer--  Mother with history of cancer.  Sister. with a history of heart failure as well as emphysema  Medications.  Crestor 10 mg daily.  Diltiazem ER 120 mg a day.  Hydralazine 25 mg 3 times a day.  Synthroid 88 mcg a day.  Protonix 40 mg twice a day.   Zetia 10 mg daily.  Colace 100 mg stools 2 times a day.  Aspirin 325 mg daily.  Aricept 10 mg daily.  Risperdal 0.5 mg each bedtime  Hospital studies.  04/06/2013.  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  Also showed Arthroscosisleft main and three-vessel coronary arteries As well as mild emphysema.  Review of systems.  Denying any fever chills appears to be in good spirits.  Skin-no complaints of itching or rashes.  Head ears eyes nose mouth and throat-as per schedule lenses does not complaining of visual changes or sore throat or nasal discharge.  Respiratory-denies any  shortness of breath or cough.  Cardiac-denies any chest pain has some baseline edema patient family states this is baseline when she has her legs in a dependent position for a while.  GI-no complaints of abdominal pain nausea vomiting diarrhea or constipation.  GU-does not complaining of  dysuria.  Muscle skeletal-history of osteoarthritis but she is not complaining of any pain today.  Neurologic-no complaints of headache dizziness or syncopal-type feelings.  Psych-extensive history as stated above-she does not complaining of anxiety today apparently she has been cooperative with staff here in taking her medication.  Physical exam.  Temperature is 97.6 pulse 70 respirations 18 blood pressure 120/68 O2 saturation 96% she does have oxygen chronically her weight is 164 there is some variability here.  In general this is a pleasant elderly female in no distress sitting comfortably in her wheelchair she is quite talkative.  Her skin is warm and dry.  Eyes pupils appear to be reactive to light visual acuity intact she has prescription lenses.  Oropharynx clear mucous membranes moist.  Chest is clear to auscultation without any rhonchi rales or wheezes.  Heart is regular rate and rhythm without murmur gallop or bile she has 1+ lower extremity edema apparently largely dependent per patient and family history-this is cool to touch nonerythematous with reduced pedal pulses bilaterally.  Abdomen is soft nontender with positive bowel sounds.  Muscle skeletal and did not note any deformities moves all extremities it appears with appropriate strength she does ambulate in a wheelchair grip strength is strong bilaterally.  Neurologic is grossly intact I do not see any lateralizing findings her speech is clear.  Psych she appears pleasant and appropriate is conversant--apparently there are periods of confusion.  Labs.  05/18/2013.  WBC 12.0 hemoglobin 11.7 platelets 320.  Neutrophil percentage is normal at 47% as was left at 32% monocytes 10% EOS of  9% although absolute lymphs mildly elevated at 3.8 as well as monocytes 1.2 andEos at 1.0  Sodium 140 potassium 4.4 BUN 20 creatinine 3.04.  Cholesterol 150 triglycerides 177 LDL 70 HDL of 45.  Hemoglobin  A1c-5.9.  TSH-3.06  Assessment and plan.  #1-chronic kidney disease-she appears to be at her baseline we'll update this first Lab  day next week to ensure stability clinically she appears stable.  #2 history of coronary artery disease-this appears stable as well she is on aspirin-as well as a statin-recent lipid panel.e fairly satisfactory as well as liver function tests.  #3-hypertension-this appears to be stable she is on diltiazem as well hydralazine  And Zetia  #4-history of bipolar disorder-with history of dementia-dementia appears to be quite mild-she has a significant psychiatric history it appears although this appears to have stabilized she is taking her medication-apparently this is the main reason she is here-continue to monitor I suspect this will be a fluid situation. She does continue on Risperdal at night this was started during her stay at [facility]  #5 hypothyroidism-she is on Synthroid recent TSH was satisfactory  #6-history CHF-will write order to monitor her weights 3 times a week--notify provider gain greater than 3 pounds she's not on any diuretics--I suspect her renal issues complicates matters a bit here-- .  #7-some questionable history of hyperglycemia -- appears hemoglobin A1c and blood sugars have been quite stable-will reduce her CBGs to every morning suspect we may even be able to titrate this down.  Number 8 constipation-apparently this has not been an issue during her stay here she is on Colace  #9-GERD-she is on  Protonix 2 times a day apparently she has been on this long term with good relief  #10-COPD-this appears to be relatively mild she has been on chronic oxygen-- O2 sats  satisfactory.  [id number] note greater than 45 minutes spent assessing patient-reviewing multiple hospital records-and coordinating and formulating plan of care  .            \

## 2013-05-26 ENCOUNTER — Non-Acute Institutional Stay (SKILLED_NURSING_FACILITY): Payer: Medicare Other | Admitting: Internal Medicine

## 2013-05-26 DIAGNOSIS — E1149 Type 2 diabetes mellitus with other diabetic neurological complication: Secondary | ICD-10-CM

## 2013-05-26 DIAGNOSIS — I2510 Atherosclerotic heart disease of native coronary artery without angina pectoris: Secondary | ICD-10-CM

## 2013-05-26 DIAGNOSIS — J441 Chronic obstructive pulmonary disease with (acute) exacerbation: Secondary | ICD-10-CM

## 2013-05-26 DIAGNOSIS — N189 Chronic kidney disease, unspecified: Secondary | ICD-10-CM

## 2013-05-26 DIAGNOSIS — E1129 Type 2 diabetes mellitus with other diabetic kidney complication: Secondary | ICD-10-CM

## 2013-05-26 NOTE — Progress Notes (Signed)
Patient ID: [name], female   DOB: Jan 09, 1941, 72 y.o.   MRN: [id number]  Facility; [facility] Chief complaint; admission to SNF post admit to [facility] health psychiatry question [facility]. Actually ultimately came here from home.  History; this is a patient who was apparently in the hospital at [facility] psychiatry. She was apparently there for management of her bipolar affective disorder although there is no information on this in the chart. She was apparently discharged from there to home however her husband could not manage her. She was seen at [facility] family practice and an FiO2 was completed for her her transfer here. There is a little additional information that comes easily to my attention.  Past Medical History  Diagnosis Date  . Osteoarthritis   . Pelvic inflammatory disease   . 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 MI (myocardial infarction)   . HTN (hypertension)   . Hyperlipidemia   . Hypothyroidism   . Anemia   . Lung nodule   . COPD (chronic obstructive pulmonary disease)   . Renal insufficiency; baseline Cr. Of 3.0   . 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 CAD (coronary artery disease)    Past Surgical History  Procedure Laterality Date  . Back surgery    . Tubal ligation    . Mandible fracture surgery     Current medications; Crestor 10 mg daily, diltiazem CD 120 daily, Synthroid 88 mcg daily, Zetia 10 mg daily, Colace 100 twice a day, Aricept 5 mg by mouth daily, Apresoline 25 mg by mouth 3 times a day, Risperdal 0.5 by mouth daily, enteric-coated aspirin 325 daily, protonic 40 mg daily  Socially; apparently living with her husband beyond this I have virtually no information. States she has been on oxygen for 20 years  reports that she has quit smoking. She does not have any smokeless tobacco history on file. She reports that she does not drink alcohol.  family history includes Cancer in her  mother; Emphysema in her sister; Heart failure in her sister; Lung cancer in her father.   review of system Respiratory; states her shortness of breath is at baseline no coughing Cardiac no exertional chest pain GI; states she is "impacted"  GU no dysuria  Physical examination Gen. patient is not obvious distress oxygen on at 1 L Vitals O2 sat 96% on 1 L respirations 20 pulse 92 HEENT few remaining teeth otherwise no oral lesions are seen Respiratory shallow but otherwise clear entry. No crackles or wheezes she has no clubbing. Work of breathing is not excessive Cardiac heart sounds are normal no murmurs no gallops no signs of heart failure Abdomen mildly distended bowel sounds positive no liver no spleen no tenderness Extremities she has compression hose on significant edema probably mostly venous stasis. There is no signs of heart failure Neurologic; she has antigravity strength in both lower extremity reflexes absent. Gait; she is able to bring herself to a standing position. Gait is shuffling slightly wide-based Mental status; some pressure of speech with circumferential thought form nevertheless either really doubt this woman has significant dementia.  Impressions plan #1 recent admission to a hospital for bipolar affective disorder. This does not seem currently out of control in a major way she is on only on a small dose of Risperdal. #2 significant chronic renal failure presumably related to her diabetes although she is not on anything for diabetes and I don't see a recent hemoglobin A1c #  3 hypothyroidism on replacement presumably her TSH was checked in the hospital #4 hyperlipidemia on Zetia and Crestor #5 significant gait ataxia presumably related to diabetic neuropathy. 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 Maple Hudson. She has been on chronic oxygen for years  according to the #7 hypertension on Apresoline and diltiazem  I really don't have a sense of whether this patient has been admitted here for rehabilitative care  vs. Chronic.  His chronic psychiatric and medical issues although none of them seem to be particularly active at the moment. Her gait without her walker is very impaired although I did not test her with her walker

## 2013-08-11 ENCOUNTER — Other Ambulatory Visit: Payer: Medicare Other

## 2013-08-25 ENCOUNTER — Non-Acute Institutional Stay (SKILLED_NURSING_FACILITY): Payer: PRIVATE HEALTH INSURANCE | Admitting: Internal Medicine

## 2013-08-25 ENCOUNTER — Ambulatory Visit: Payer: Medicare Other | Admitting: Internal Medicine

## 2013-08-25 DIAGNOSIS — N189 Chronic kidney disease, unspecified: Secondary | ICD-10-CM

## 2013-08-25 DIAGNOSIS — E1165 Type 2 diabetes mellitus with hyperglycemia: Secondary | ICD-10-CM

## 2013-08-25 DIAGNOSIS — E1129 Type 2 diabetes mellitus with other diabetic kidney complication: Secondary | ICD-10-CM

## 2013-08-25 DIAGNOSIS — J441 Chronic obstructive pulmonary disease with (acute) exacerbation: Secondary | ICD-10-CM

## 2013-08-30 NOTE — Progress Notes (Signed)
Patient ID: Holly FeeBarbara F Rispoli, female   DOB: Dec 29, 1940, 73 y.o.   MRN: [id number]                  HISTORY & PHYSICAL  DATE:  08/25/2013    FAC[facility]: [facility]    LEVEL OF CARE:   SNF   HISTORY OF PRESENT ILLNESS:  This is a patient whom I admitted to this facility in early December 2014.  At that point, she had been a psychiatry inpatient in [place].  She was temporarily returned home, but family could not manage her and she was admitted here.  The patient is listed as having dementia, although she has very little evidence of this.  If any, it is mild.  Her Folstein score is 29/30.      PAST MEDICAL HISTORY/PROBLEM LIST:  Includes:    COPD, for which she is followed by Dr. Maple HudsonYoung.  She has a history of a lung nodule.    Coronary artery disease.     Hypertension.     Congestive heart failure.    Chronic renal failure with last creatinine check at 2.17 on 06/03/2013.  Her hemoglobin A1c was 5.7.    She has a history of an ejection fraction of 40-45%.  She has not used a rescue inhaler or her O2.     CURRENT MEDICATIONS:  Medication list is reviewed.    Synthroid 88 mcg a day.    Aricept 5 q.d.    Protonix 40 q.d.    Diltiazem CD 120 q.d.    MiraLAX 17 g daily.    Hydralazine 25 three times a day.    Zetia 10 mg daily.    Risperdal 0.5 at 5 p.m.    REVIEW OF SYSTEMS:   CHEST/RESPIRATORY:  The patient is not complaining of shortness of breath.    CARDIAC:   No chest pain.   GI:  No nausea, vomiting or diarrhea.   PSYCHIATRIC:   She seems reasonably normal.  If there are any cognitive issues here, it is mild.  Certainly no major depression, either.    PHYSICAL EXAMINATION:   VITAL SIGNS:   TEMPERATURE:  96.5.   PULSE:  74.   RESPIRATIONS:  20.   BLOOD PRESSURE:  120/60.   WEIGHT:  156 pounds.   CHEST/RESPIRATORY:  Mildly reduced air entry, but no wheezing.  No accessory muscle use.   CARDIOVASCULAR:  CARDIAC:   Heart sounds are normal.  There are no  murmurs.   GASTROINTESTINAL:  LIVER/SPLEEN/KIDNEYS:  No liver, no spleen.  No tenderness.    ASSESSMENT/PLAN:  Dementia with behavioral disturbances.  The patient is on Aricept.  Folstein 29/30 means her dementia is very mild.    Hypothyroidism.  On replacement.     Hyperlipidemia.  On Zetia and Lovaza.  She is also on aspirin.    COPD.  This does not appear to be unstable.    History of pulmonary nodules.  This showed the appearance of the chest compatible with a chronic atypical infectious process.  There was peribronchial vascular interstitial thickening and micro- and macronodular densities.  Again, this is followed by Dr. Maple HudsonYoung.  She is due to have a follow-up CT scan in four months.     CPT CODE: [id number]

## 2013-08-31 ENCOUNTER — Ambulatory Visit (INDEPENDENT_AMBULATORY_CARE_PROVIDER_SITE_OTHER)
Admission: RE | Admit: 2013-08-31 | Discharge: 2013-08-31 | Disposition: A | Discharge status: Home / Self Care | Payer: PRIVATE HEALTH INSURANCE | Source: Ambulatory Visit | Attending: Internal Medicine | Admitting: Internal Medicine

## 2013-08-31 DIAGNOSIS — J984 Other disorders of lung: Secondary | ICD-10-CM

## 2013-09-10 ENCOUNTER — Ambulatory Visit: Payer: Medicare Other | Admitting: Internal Medicine

## 2013-09-21 NOTE — Progress Notes (Signed)
Quick Note:  Called and spoke to pt regarding results. Pt verbalized understanding and denied any further questions or concerns at this time. ______ 

## 2013-10-09 ENCOUNTER — Non-Acute Institutional Stay (SKILLED_NURSING_FACILITY): Payer: PRIVATE HEALTH INSURANCE | Admitting: Internal Medicine

## 2013-10-09 DIAGNOSIS — E1165 Type 2 diabetes mellitus with hyperglycemia: Principal | ICD-10-CM

## 2013-10-09 DIAGNOSIS — E1129 Type 2 diabetes mellitus with other diabetic kidney complication: Secondary | ICD-10-CM

## 2013-10-09 DIAGNOSIS — I10 Essential (primary) hypertension: Secondary | ICD-10-CM

## 2013-10-09 DIAGNOSIS — N189 Chronic kidney disease, unspecified: Secondary | ICD-10-CM

## 2013-10-09 NOTE — Progress Notes (Signed)
Patient ID: [name], female   DOB: 12/16/1940, 73 y.o.   MRN:                  Progress note  DATE:  10/09/2013  FAC[facility]    LEVEL OF CARE:   SNF   HISTORY OF PRESENT ILLNESS:  This is a patient whom I admitted to this facility in early December 2014.  At that point, she had been a psychiatry inpatient in [place].  She was temporarily returned home, but family could not manage her and she was admitted here.  The patient is listed as having dementia, although she has very little evidence of this.  If any, it is mild.  Her Folstein score is 29/30.      Patient has significant chronic renal failure and hypertension. Last BUN 41 creatinine 2.46 on 3/16. Hemoglobin 11.1  Her hydralazine was reduced to 10 mg 3 times a day I am not quite sure why although her blood pressure appears to be stable  PAST MEDICAL HISTORY/PROBLEM LIST:  Includes:    COPD, for which she is followed by Dr. Maple HudsonYoung.  She has a history of  lung nodules.    Coronary artery disease.     Hypertension.     Congestive heart failure.    Chronic renal failure with last creatinine 2.46.  Her hemoglobin A1c was 5.7.    She has a history of an ejection fraction of 40-45%.  She has not used a rescue inhaler or her O2.     Hypothyroidism on replacement  Dementia, not exactly sure if this fits vs. mild cognitive impairment  CURRENT MEDICATIONS:  Medication list is reviewed.    Synthroid 88 mcg a day.    Aricept 5 q.d.    Protonix 40 q.d.    Diltiazem CD 120 q.d.    MiraLAX 17 g daily.    Hydralazine 10 three times a day.    Zetia 10 mg daily.    Risperdal 0.5 at 5 p.m.    REVIEW OF SYSTEMS:   CHEST/RESPIRATORY:  The patient is not complaining of shortness of breath.    CARDIAC:   No chest pain.   GI:  No nausea, vomiting or diarrhea.   PSYCHIATRIC:   She seems reasonably normal.  If there are any cognitive issues here, it is mild.  Certainly no major depression, either.     PHYSICAL EXAMINATION:   VITAL SIGNS:   TEMPERATURE:  96.5.   PULSE:  74.   RESPIRATIONS:  20.   BLOOD PRESSURE:  120/60.   WEIGHT:  156 pounds.   CHEST/RESPIRATORY:  Mildly reduced air entry, but no wheezing.  No accessory muscle use.   CARDIOVASCULAR:  CARDIAC:   Heart sounds are normal.  There are no murmurs.   GASTROINTESTINAL:  LIVER/SPLEEN/KIDNEYS:  No liver, no spleen.  No tenderness.    ASSESSMENT/PLAN:  Dementia with behavioral disturbances.  The patient is on Aricept.  Folstein 29/30 means her dementia is very mild.    Hypothyroidism.  On replacement.     Hyperlipidemia.  On Zetia and Lovaza.  She is also on aspirin.    COPD.  This does not appear to be unstable.    History of pulmonary nodules. 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 Her hemoglobin A1c is very stable.      Study Result    CLINICAL DATA: Lung nodule follow-up.  EXAM:  CT CHEST WITHOUT CONTRAST  TECHNIQUE:  Multidetector CT imaging of the chest was performed following the  standard protocol without IV contrast.  COMPARISON: DG CHEST 1V PORT dated 04/07/2013; CT CHEST W/O CM  dated 04/01/2013  FINDINGS:  Again noted are areas of peribronchial thickening and areas of  bronchiectasis within the lingula and right middle lobe. Associated  micro and macro nodularity noted. These findings are stable since  prior study.  Scattered nodules also noted in the lower lobes. Index left lower  lobe nodule on image 28 measures 6 mm, stable. Index right lower  lobe nodule on image 35 measures 4 mm, stable. The other multiple  lower lobe nodules are also stable. No new or enlarging pulmonary  nodules. No pleural effusions.  Heart is normal size. Aorta is normal caliber. Coronary artery and  aortic  calcifications. No mediastinal, hilar, or axillary  adenopathy. Chest wall soft tissues are unremarkable. Imaging into  the upper abdomen shows no acute findings.  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  Scattered bilateral lower lobe nodules are stable. No enlarging  nodules.  Coronary artery disease.  Electronically Signed  By: Charlett NoseKevin Dover M.D.  On: 08/31/2013 13:54

## 2013-10-21 ENCOUNTER — Ambulatory Visit (INDEPENDENT_AMBULATORY_CARE_PROVIDER_SITE_OTHER): Payer: Medicare Other | Admitting: Internal Medicine

## 2013-10-21 ENCOUNTER — Encounter (INDEPENDENT_AMBULATORY_CARE_PROVIDER_SITE_OTHER): Payer: Self-pay

## 2013-10-21 ENCOUNTER — Encounter: Payer: Self-pay | Admitting: Internal Medicine

## 2013-10-21 VITALS — BP 132/60 | HR 77 | Ht 62.0 in | Wt 147.6 lb

## 2013-10-21 DIAGNOSIS — R918 Other nonspecific abnormal finding of lung field: Secondary | ICD-10-CM

## 2013-10-21 DIAGNOSIS — I5040 Unspecified combined systolic (congestive) and diastolic (congestive) heart failure: Secondary | ICD-10-CM

## 2013-10-21 NOTE — Patient Instructions (Signed)
Please call as needed 

## 2013-10-21 NOTE — Progress Notes (Signed)
Patient ID: Holly FeeBarbara F Gahm, female    DOB: 06-Feb-1941, 73 y.o.   MRN: [id number]  HPI 10/15/10 73 yo F former smoker followed for COPD and hx lung nodule complicated by CAD, HTN, CHF, now with renal failure, but not yet on dialysis- Dr Hyman HopesWebb.  Here with husband. Last here April 16, 2010. Hx EF 40-45% as noted in my notes reviewed from last visit.  She notes dyspnea on chronic basis and doesn't feel she could skip O2, but denies chest pain, palpitation, phlegm,  Blood or ankle edema. O2 stays 1.5 to 2 L/M. She hasn't used rescue inhaler in a long time.   10/15/11- 73 yo F former smoker followed for COPD and hx lung nodule complicated by CAD, HTN, CHF, now with renal failure, but not yet on dialysis- Dr Hyman HopesWebb.  Here with husband.   PCP Dr Creta LevinStallings/ [facility] Has not needed dialysis yet but is being followed very closely by nephrology since last here. Takes Procrit for anemia so we discussed the impact of anemia on shortness of breath. Remains SOB with limited activity.Sensitive to weather change. Little cough. Occ wheeze. Avoids Ventolin-too jittery. CHEST - 2 VIEW 01/02/11: Comparison: CT chest 04/16/2010. Plain films of the chest  07/22/2009 06/22/2009.  Findings: There is some thickening along the minor fissure. Lungs  are clear. No pneumothorax or effusion. Heart size normal.  IMPRESSION:  No acute disease.   03/29/13- 73 yo F former smoker followed for COPD and hx lung nodule complicated by CAD, HTN, CHF, now with renal failure, but not yet on dialysis- Dr Hyman HopesWebb.  Here with husband.   PCP Dr Creta LevinStallings/ [facility] FOLLOWS FOR: continues to have SOB with activity; has started doing slight exercise.  04/06/13- 73 yo F former smoker followed for COPD and hx lung nodule complicated by CAD, HTN, CHF, now with renal failure, but not yet on dialysis- Dr Hyman HopesWebb.  Here with daughter and  husband.   PCP Dr Creta LevinStallings/ [facility] FOLLOWS FOR: review CT Chest  results with patient and family. Nurse reports patient has stopped depakote/ behavioral meds No fever or sweats. Cough only from her denture powder. O2 1.5-2L/ sleep/ [facility] Abnl CXR-> CT chest 04/01/13-  IMPRESSION:  1. 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  2. Atherosclerosis, including left main and 3 vessel coronary artery  disease. Assessment for potential risk factor modification, dietary  therapy or pharmacologic therapy may be warranted, if clinically  indicated.  3. Mild centrilobular emphysema.  Electronically Signed  By: Trudie Reedaniel Entrikin M.D.  On: 04/01/2013 18:39  10/21/13- 73 yo F former smoker followed for COPD and hx lung nodule/ ?MAIC complicated by CAD, HTN, CHF, now with renal failure, but not yet on dialysis- Dr Hyman HopesWebb.  Here with aide.   PCP Dr Creta LevinStallings/ [facility] FOLLOWS FOR:  Discuss CT results. O2 1.5-2L/ sleep/ [facility] She denies night sweats, fever, cough. Had pneumonia [facility]. 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  Scattered bilateral lower lobe nodules are stable. No enlarging  nodules.  Coronary artery disease.  Electronically Signed  By: Charlett NoseKevin Dover M.D.  On: 08/31/2013 13:54   ROS-see HPI Constitutional:   No-   weight loss, night sweats, fevers, chills, fatigue, lassitude. HEENT:   No-  headaches, difficulty swallowing, tooth/dental problems, sore throat,       No-  sneezing, itching, ear ache, nasal congestion, post nasal drip,  CV:  No-    chest pain, orthopnea, PND, swelling in lower extremities, anasarca,dizziness, palpitations Resp: +  shortness of breath with exertion or at rest.              No-   productive cough,  No non-productive cough,  No- coughing up of blood.              No-   change in color of mucus.  Little wheezing.   Skin: No-   rash or lesions. GI:  No-   heartburn, indigestion, abdominal pain, nausea, vomiting, GU: . MS:  No-   joint pain or swelling.   Neuro-     nothing unusual Psych:  No- change in mood or affect. No depression or anxiety.  No memory loss.  OBJ- Physical Exam General- Alert, Oriented, Affect-appropriate, Distress- none acute, +wheelchair,  Skin- rash-none, lesions- none, excoriation- none Lymphadenopathy- none Head- atraumatic            Eyes- Gross vision intact, PERRLA, conjunctivae and secretions clear            Ears- Hearing, canals-normal            Nose- Clear, no-Septal dev, mucus, polyps, erosion, perforation             Throat- Mallampati II , mucosa clear , drainage- none, tonsils- atrophic. +Upper denture,                      missing  lower teeth Neck- flexible , trachea midline, no stridor , thyroid nl, carotid no bruit Chest - symmetrical excursion , unlabored           Heart/CV- RRR , no murmur , no gallop  , no rub, nl s1 s2                           - JVD- none , edema- none, stasis changes- none, varices- none           Lung- clear to P&A, wheeze- none, cough- none , dullness-none, rub- none           Chest wall-  Abd-  Br/ Gen/ Rectal- Not done, not indicated Extrem- cyanosis- none, clubbing, none, atrophy- none, strength- nl Neuro- grossly intact to observation

## 2013-11-17 ENCOUNTER — Non-Acute Institutional Stay (SKILLED_NURSING_FACILITY): Payer: PRIVATE HEALTH INSURANCE | Admitting: Internal Medicine

## 2013-11-17 DIAGNOSIS — E1149 Type 2 diabetes mellitus with other diabetic neurological complication: Secondary | ICD-10-CM

## 2013-11-17 DIAGNOSIS — N189 Chronic kidney disease, unspecified: Secondary | ICD-10-CM

## 2013-11-17 NOTE — Progress Notes (Signed)
Patient ID: [name], female   DOB: February 22, 1941, 73 y.o.   MRN: [id number]                   Progress note  DATE:  11/17/2013  [facility]    LEVEL OF CARE:   SNF   HISTORY OF PRESENT ILLNESS:  This is a patient whom I admitted to this facility in early December 2014.  At that point, she had been a psychiatry inpatient in [place].  She was temporarily returned home, but family could not manage her and she was admitted here.  The patient is listed as having dementia, although she has very little evidence of this.  If any, it is mild.  Her Folstein score is 29/30.      Patient has significant chronic renal failure and hypertension.I see no recent labs  Her hydralazine was reduced to 10 mg 3 times a day I am not quite sure why although her blood pressure appears to be stable  PAST MEDICAL HISTORY/PROBLEM LIST:  Includes:    COPD, for which she is followed by Dr. Maple Hudson.  She has a history of  lung nodules.    Coronary artery disease.     Hypertension.     Congestive heart failure.    Chronic renal failure with last creatinine 2..17.  Her hemoglobin A1c was 5.7.    She has a history of an ejection fraction of 40-45%.  She has not used a rescue inhaler or her O2.     Hypothyroidism on replacement  Dementia, not exactly sure if this fits vs. mild cognitive impairment  CURRENT MEDICATIONS:  Medication list is reviewed.    Synthroid 88 mcg a day.    Aricept 5 q.d.    Protonix 40 q.d.    Diltiazem CD 120 q.d.    MiraLAX 17 g daily.    Hydralazine 10 three times a day.    Zetia 10 mg daily.    Risperdal 0.5 at 5 p.m.   Ecotrin 325  lovaza 2 gm qd  Vit D3 50000 q month   REVIEW OF SYSTEMS:   CHEST/RESPIRATORY:  The patient is not complaining of shortness of breath.    CARDIAC:   No chest pain.   GI:  No nausea, vomiting or diarrhea.   PSYCHIATRIC:   She seems reasonably normal.  If there are any cognitive issues here, it is mild.   Certainly no major depression, either.    PHYSICAL EXAMINATION:   VITAL SIGNS:   TEMPERATURE:  96.5.   PULSE:  74.   RESPIRATIONS:  20.   BLOOD PRESSURE:  120/60.   WEIGHT:  156 pounds.   CHEST/RESPIRATORY:  Mildly reduced air entry, but no wheezing.  No accessory muscle use.   CARDIOVASCULAR:  CARDIAC:   Heart sounds are normal.  There are no murmurs.   GASTROINTESTINAL:  LIVER/SPLEEN/KIDNEYS:  No liver, no spleen.  No tenderness.    ASSESSMENT/PLAN:  Dementia with behavioral disturbances.  The patient is on Aricept.  Folstein 29/30 means her dementia is very mild.  I see no good reason for the Risperdal  Hypothyroidism.  On replacement.     Hyperlipidemia.  On Zetia and Lovaza.  She is also on aspirin.    COPD.  This does not appear to be unstable.    History of pulmonary nodules. 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 This is presumably  diabetic although she is not on any current medications  History of CHF but this appears to be stable  Listed  As have DM  Type 2 she is not currently on any medications. Her hemoglobin A1c is very stable.      Study Result    CLINICAL DATA: Lung nodule follow-up.  EXAM:  CT CHEST WITHOUT CONTRAST  TECHNIQUE:  Multidetector CT imaging of the chest was performed following the  standard protocol without IV contrast.  COMPARISON: DG CHEST 1V PORT dated 04/07/2013; CT CHEST W/O CM  dated 04/01/2013  FINDINGS:  Again noted are areas of peribronchial thickening and areas of  bronchiectasis within the lingula and right middle lobe. Associated  micro and macro nodularity noted. These findings are stable since  prior study.  Scattered nodules also noted in the lower lobes. Index left lower  lobe nodule on image 28 measures 6 mm, stable. Index right lower  lobe nodule on image 35 measures 4 mm, stable. The other multiple  lower lobe nodules are also  stable. No new or enlarging pulmonary  nodules. No pleural effusions.  Heart is normal size. Aorta is normal caliber. Coronary artery and  aortic calcifications. No mediastinal, hilar, or axillary  adenopathy. Chest wall soft tissues are unremarkable. Imaging into  the upper abdomen shows no acute findings.  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  Scattered bilateral lower lobe nodules are stable. No enlarging  nodules.  Coronary artery disease.  Electronically Signed  By: Charlett NoseKevin Dover M.D.  On: 08/31/2013 13:54

## 2013-11-29 ENCOUNTER — Encounter: Payer: Self-pay | Admitting: Internal Medicine

## 2013-11-29 NOTE — Assessment & Plan Note (Signed)
Clinically controlled

## 2013-11-29 NOTE — Assessment & Plan Note (Signed)
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 She is asymptomatic and appears stable. We agreed to follow conservatively.

## 2014-04-25 ENCOUNTER — Ambulatory Visit: Payer: Medicare Other | Admitting: Internal Medicine

## 2014-05-10 ENCOUNTER — Emergency Department (HOSPITAL_COMMUNITY): Payer: Medicare Other

## 2014-05-10 ENCOUNTER — Encounter (HOSPITAL_COMMUNITY): Payer: Self-pay | Admitting: Cardiology

## 2014-05-10 ENCOUNTER — Inpatient Hospital Stay (HOSPITAL_COMMUNITY)
Admission: EM | Admit: 2014-05-10 | Discharge: 2014-05-17 | DRG: 871 | Disposition: E | Payer: Medicare Other | Attending: Family Medicine | Admitting: Family Medicine

## 2014-05-10 DIAGNOSIS — E785 Hyperlipidemia, unspecified: Secondary | ICD-10-CM | POA: Diagnosis present

## 2014-05-10 DIAGNOSIS — I5031 Acute diastolic (congestive) heart failure: Secondary | ICD-10-CM | POA: Diagnosis present

## 2014-05-10 DIAGNOSIS — E119 Type 2 diabetes mellitus without complications: Secondary | ICD-10-CM | POA: Diagnosis present

## 2014-05-10 DIAGNOSIS — E872 Acidosis, unspecified: Secondary | ICD-10-CM

## 2014-05-10 DIAGNOSIS — E875 Hyperkalemia: Secondary | ICD-10-CM | POA: Diagnosis present

## 2014-05-10 DIAGNOSIS — D649 Anemia, unspecified: Secondary | ICD-10-CM | POA: Diagnosis present

## 2014-05-10 DIAGNOSIS — Z79899 Other long term (current) drug therapy: Secondary | ICD-10-CM | POA: Diagnosis not present

## 2014-05-10 DIAGNOSIS — Z801 Family history of malignant neoplasm of trachea, bronchus and lung: Secondary | ICD-10-CM | POA: Diagnosis not present

## 2014-05-10 DIAGNOSIS — N179 Acute kidney failure, unspecified: Secondary | ICD-10-CM

## 2014-05-10 DIAGNOSIS — Z7952 Long term (current) use of systemic steroids: Secondary | ICD-10-CM | POA: Diagnosis not present

## 2014-05-10 DIAGNOSIS — I10 Essential (primary) hypertension: Secondary | ICD-10-CM

## 2014-05-10 DIAGNOSIS — J9621 Acute and chronic respiratory failure with hypoxia: Secondary | ICD-10-CM | POA: Diagnosis present

## 2014-05-10 DIAGNOSIS — Z66 Do not resuscitate: Secondary | ICD-10-CM | POA: Diagnosis present

## 2014-05-10 DIAGNOSIS — I252 Old myocardial infarction: Secondary | ICD-10-CM | POA: Diagnosis not present

## 2014-05-10 DIAGNOSIS — I5041 Acute combined systolic (congestive) and diastolic (congestive) heart failure: Secondary | ICD-10-CM | POA: Diagnosis present

## 2014-05-10 DIAGNOSIS — Z91041 Radiographic dye allergy status: Secondary | ICD-10-CM

## 2014-05-10 DIAGNOSIS — Z881 Allergy status to other antibiotic agents status: Secondary | ICD-10-CM

## 2014-05-10 DIAGNOSIS — R6521 Severe sepsis with septic shock: Secondary | ICD-10-CM

## 2014-05-10 DIAGNOSIS — K219 Gastro-esophageal reflux disease without esophagitis: Secondary | ICD-10-CM

## 2014-05-10 DIAGNOSIS — Z808 Family history of malignant neoplasm of other organs or systems: Secondary | ICD-10-CM | POA: Diagnosis not present

## 2014-05-10 DIAGNOSIS — Z87891 Personal history of nicotine dependence: Secondary | ICD-10-CM | POA: Diagnosis not present

## 2014-05-10 DIAGNOSIS — Z91040 Latex allergy status: Secondary | ICD-10-CM

## 2014-05-10 DIAGNOSIS — J189 Pneumonia, unspecified organism: Secondary | ICD-10-CM | POA: Diagnosis present

## 2014-05-10 DIAGNOSIS — E039 Hypothyroidism, unspecified: Secondary | ICD-10-CM | POA: Diagnosis present

## 2014-05-10 DIAGNOSIS — J440 Chronic obstructive pulmonary disease with acute lower respiratory infection: Secondary | ICD-10-CM | POA: Diagnosis present

## 2014-05-10 DIAGNOSIS — M1990 Unspecified osteoarthritis, unspecified site: Secondary | ICD-10-CM | POA: Diagnosis present

## 2014-05-10 DIAGNOSIS — Y95 Nosocomial condition: Secondary | ICD-10-CM | POA: Diagnosis present

## 2014-05-10 DIAGNOSIS — F0390 Unspecified dementia without behavioral disturbance: Secondary | ICD-10-CM

## 2014-05-10 DIAGNOSIS — R509 Fever, unspecified: Secondary | ICD-10-CM | POA: Diagnosis present

## 2014-05-10 DIAGNOSIS — Z7982 Long term (current) use of aspirin: Secondary | ICD-10-CM | POA: Diagnosis not present

## 2014-05-10 DIAGNOSIS — I2510 Atherosclerotic heart disease of native coronary artery without angina pectoris: Secondary | ICD-10-CM | POA: Diagnosis present

## 2014-05-10 DIAGNOSIS — N184 Chronic kidney disease, stage 4 (severe): Secondary | ICD-10-CM | POA: Diagnosis present

## 2014-05-10 DIAGNOSIS — J441 Chronic obstructive pulmonary disease with (acute) exacerbation: Secondary | ICD-10-CM

## 2014-05-10 DIAGNOSIS — I129 Hypertensive chronic kidney disease with stage 1 through stage 4 chronic kidney disease, or unspecified chronic kidney disease: Secondary | ICD-10-CM | POA: Diagnosis present

## 2014-05-10 DIAGNOSIS — A419 Sepsis, unspecified organism: Principal | ICD-10-CM

## 2014-05-10 DIAGNOSIS — E860 Dehydration: Secondary | ICD-10-CM | POA: Diagnosis present

## 2014-05-10 DIAGNOSIS — Z888 Allergy status to other drugs, medicaments and biological substances status: Secondary | ICD-10-CM

## 2014-05-10 DIAGNOSIS — Z825 Family history of asthma and other chronic lower respiratory diseases: Secondary | ICD-10-CM | POA: Diagnosis not present

## 2014-05-10 DIAGNOSIS — Z8249 Family history of ischemic heart disease and other diseases of the circulatory system: Secondary | ICD-10-CM

## 2014-05-10 DIAGNOSIS — J9620 Acute and chronic respiratory failure, unspecified whether with hypoxia or hypercapnia: Secondary | ICD-10-CM

## 2014-05-10 LAB — CBC WITH DIFFERENTIAL/PLATELET
BASOS PCT: 1 % (ref 0–1)
Basophils Absolute: 0 10*3/uL (ref 0.0–0.1)
EOS ABS: 0 10*3/uL (ref 0.0–0.7)
Eosinophils Relative: 0 % (ref 0–5)
HEMATOCRIT: 34.5 % — AB (ref 36.0–46.0)
Hemoglobin: 11.1 g/dL — ABNORMAL LOW (ref 12.0–15.0)
Lymphocytes Relative: 28 % (ref 12–46)
Lymphs Abs: 0.9 10*3/uL (ref 0.7–4.0)
MCH: 29.4 pg (ref 26.0–34.0)
MCHC: 32.2 g/dL (ref 30.0–36.0)
MCV: 91.5 fL (ref 78.0–100.0)
MONO ABS: 0.2 10*3/uL (ref 0.1–1.0)
Monocytes Relative: 6 % (ref 3–12)
Neutro Abs: 2.1 10*3/uL (ref 1.7–7.7)
Neutrophils Relative %: 65 % (ref 43–77)
Platelets: 203 10*3/uL (ref 150–400)
RBC: 3.77 MIL/uL — ABNORMAL LOW (ref 3.87–5.11)
RDW: 13.2 % (ref 11.5–15.5)
WBC: 3.2 10*3/uL — ABNORMAL LOW (ref 4.0–10.5)

## 2014-05-10 LAB — BLOOD GAS, ARTERIAL
ACID-BASE DEFICIT: 10.9 mmol/L — AB (ref 0.0–2.0)
Bicarbonate: 15.1 mEq/L — ABNORMAL LOW (ref 20.0–24.0)
Drawn by: 38235
FIO2: 1 %
O2 SAT: 98.1 %
PO2 ART: 126 mmHg — AB (ref 80.0–100.0)
Patient temperature: 37
TCO2: 14.4 mmol/L (ref 0–100)
pCO2 arterial: 35.5 mmHg (ref 35.0–45.0)
pH, Arterial: 7.252 — ABNORMAL LOW (ref 7.350–7.450)

## 2014-05-10 LAB — COMPREHENSIVE METABOLIC PANEL
ALT: 25 U/L (ref 0–35)
ANION GAP: 22 — AB (ref 5–15)
AST: 49 U/L — ABNORMAL HIGH (ref 0–37)
Albumin: 2.3 g/dL — ABNORMAL LOW (ref 3.5–5.2)
Alkaline Phosphatase: 47 U/L (ref 39–117)
BUN: 44 mg/dL — ABNORMAL HIGH (ref 6–23)
CO2: 16 mEq/L — ABNORMAL LOW (ref 19–32)
Calcium: 8.5 mg/dL (ref 8.4–10.5)
Chloride: 99 mEq/L (ref 96–112)
Creatinine, Ser: 4.85 mg/dL — ABNORMAL HIGH (ref 0.50–1.10)
GFR calc non Af Amer: 8 mL/min — ABNORMAL LOW (ref >90)
GFR, EST AFRICAN AMERICAN: 9 mL/min — AB (ref >90)
GLUCOSE: 92 mg/dL (ref 70–99)
POTASSIUM: 5.4 meq/L — AB (ref 3.7–5.3)
Sodium: 137 mEq/L (ref 137–147)
TOTAL PROTEIN: 5.8 g/dL — AB (ref 6.0–8.3)
Total Bilirubin: 0.2 mg/dL — ABNORMAL LOW (ref 0.3–1.2)

## 2014-05-10 LAB — PRO B NATRIURETIC PEPTIDE: Pro B Natriuretic peptide (BNP): 20952 pg/mL — ABNORMAL HIGH (ref 0–450)

## 2014-05-10 LAB — PROTIME-INR
INR: 1.41 (ref 0.00–1.49)
PROTHROMBIN TIME: 17.4 s — AB (ref 11.6–15.2)

## 2014-05-10 LAB — FIBRINOGEN: Fibrinogen: 603 mg/dL — ABNORMAL HIGH (ref 204–475)

## 2014-05-10 LAB — I-STAT CG4 LACTIC ACID, ED: Lactic Acid, Venous: 7.38 mmol/L — ABNORMAL HIGH (ref 0.5–2.2)

## 2014-05-10 LAB — TROPONIN I

## 2014-05-10 LAB — MRSA PCR SCREENING: MRSA by PCR: NEGATIVE

## 2014-05-10 LAB — APTT: aPTT: 35 seconds (ref 24–37)

## 2014-05-10 MED ORDER — SODIUM CHLORIDE 0.9 % IV SOLN
INTRAVENOUS | Status: DC
  Administered 2014-05-10: 22:00:00 via INTRAVENOUS

## 2014-05-10 MED ORDER — PANTOPRAZOLE SODIUM 40 MG PO TBEC
40.0000 mg | DELAYED_RELEASE_TABLET | Freq: Two times a day (BID) | ORAL | Status: DC
  Administered 2014-05-10: 40 mg via ORAL
  Filled 2014-05-10: qty 1

## 2014-05-10 MED ORDER — ONDANSETRON HCL 4 MG PO TABS: 4.0000 mg | ORAL_TABLET | Freq: Four times a day (QID) | ORAL | Status: DC | PRN

## 2014-05-10 MED ORDER — IPRATROPIUM-ALBUTEROL 0.5-2.5 (3) MG/3ML IN SOLN
3.0000 mL | RESPIRATORY_TRACT | Status: DC
Start: 2014-05-10 — End: 2014-05-11
  Administered 2014-05-10: 3 mL via RESPIRATORY_TRACT
  Filled 2014-05-10 (×2): qty 3

## 2014-05-10 MED ORDER — AZTREONAM 1 G IJ SOLR
INTRAMUSCULAR | Status: AC
  Filled 2014-05-10: qty 1

## 2014-05-10 MED ORDER — HEPARIN SODIUM (PORCINE) 5000 UNIT/ML IJ SOLN
5000.0000 [IU] | Freq: Three times a day (TID) | INTRAMUSCULAR | Status: DC
  Administered 2014-05-10: 5000 [IU] via SUBCUTANEOUS
  Filled 2014-05-10: qty 1

## 2014-05-10 MED ORDER — VANCOMYCIN HCL IN DEXTROSE 1-5 GM/200ML-% IV SOLN
1000.0000 mg | Freq: Once | INTRAVENOUS | Status: AC
  Administered 2014-05-10: 1000 mg via INTRAVENOUS
  Filled 2014-05-10: qty 200

## 2014-05-10 MED ORDER — DEXTROSE 5 % IV SOLN
500.0000 mg | Freq: Three times a day (TID) | INTRAVENOUS | Status: DC
  Filled 2014-05-10 (×8): qty 0.5

## 2014-05-10 MED ORDER — DONEPEZIL HCL 5 MG PO TABS
5.0000 mg | ORAL_TABLET | Freq: Every day | ORAL | Status: DC
  Administered 2014-05-10: 5 mg via ORAL
  Filled 2014-05-10: qty 1

## 2014-05-10 MED ORDER — SODIUM CHLORIDE 0.9 % IV BOLUS (SEPSIS): 500.0000 mL | INTRAVENOUS | Status: DC

## 2014-05-10 MED ORDER — ONDANSETRON HCL 4 MG/2ML IJ SOLN: 4.0000 mg | Freq: Four times a day (QID) | INTRAMUSCULAR | Status: DC | PRN

## 2014-05-10 MED ORDER — DEXTROSE 5 % IV SOLN
1.0000 g | Freq: Once | INTRAVENOUS | Status: AC
  Administered 2014-05-10: 1 g via INTRAVENOUS
  Filled 2014-05-10: qty 1

## 2014-05-10 MED ORDER — EZETIMIBE 10 MG PO TABS
10.0000 mg | ORAL_TABLET | Freq: Every evening | ORAL | Status: DC
  Administered 2014-05-10: 10 mg via ORAL
  Filled 2014-05-10: qty 1

## 2014-05-10 MED ORDER — MORPHINE SULFATE 2 MG/ML IJ SOLN
1.0000 mg | Freq: Once | INTRAMUSCULAR | Status: AC
  Administered 2014-05-10: 1 mg via INTRAVENOUS
  Filled 2014-05-10: qty 1

## 2014-05-10 MED ORDER — SODIUM CHLORIDE 0.9 % IV BOLUS (SEPSIS)
1000.0000 mL | INTRAVENOUS | Status: AC
  Administered 2014-05-10: 1000 mL via INTRAVENOUS

## 2014-05-10 MED ORDER — SODIUM CHLORIDE 0.9 % IV BOLUS (SEPSIS)
1000.0000 mL | Freq: Once | INTRAVENOUS | Status: AC
  Administered 2014-05-10: 1000 mL via INTRAVENOUS

## 2014-05-10 MED ORDER — ACETAMINOPHEN 650 MG RE SUPP
650.0000 mg | Freq: Four times a day (QID) | RECTAL | Status: DC | PRN
  Administered 2014-05-10 – 2014-05-11 (×2): 650 mg via RECTAL
  Filled 2014-05-10 (×2): qty 1

## 2014-05-10 MED ORDER — ASPIRIN EC 325 MG PO TBEC
325.0000 mg | DELAYED_RELEASE_TABLET | Freq: Every morning | ORAL | Status: DC
Start: 2014-05-11 — End: 2014-05-11

## 2014-05-10 MED ORDER — SODIUM CHLORIDE 0.9 % IJ SOLN: 3.0000 mL | Freq: Two times a day (BID) | INTRAMUSCULAR | Status: DC

## 2014-05-10 MED ORDER — SODIUM CHLORIDE 0.9 % IV BOLUS (SEPSIS): 1000.0000 mL | Freq: Once | INTRAVENOUS | Status: DC

## 2014-05-10 MED ORDER — DEXTROSE 5 % IV SOLN: 1.0000 g | Freq: Once | INTRAVENOUS | Status: DC

## 2014-05-10 MED ORDER — DEXTROSE 5 % IV SOLN: 2.0000 g | Freq: Once | INTRAVENOUS | Status: DC

## 2014-05-10 NOTE — ED Provider Notes (Signed)
CSN: [id number]     Arrival date & time 05/02/2014  1746 History   First MD Initiated Contact with Patient 05/04/2014 1751     Chief Complaint  Patient presents with  . Shortness of Breath  . Hypotension     (Consider location/radiation/quality/duration/timing/severity/associated sxs/prior Treatment) HPI  This is a 73 year old female with a history of CAD, hypertension, hyperlipidemia, COPD, diabetes, and dementia who presents with fever, hypoxia, and hypotension. History is taken from the patient's daughter. Per the patient's daughter, she began to develop fever last night. She would not eat and became altered. [facility] she was noted to have a fever and was diagnosed with pneumonia. She had multiple episodes of low blood pressure. Per the daughter and documentation of the bedside, patient is DO NOT RESUSCITATE/DO NOT INTUBATE.   Level V caveat for acuity of condition and altered mental status  Past Medical History  Diagnosis Date  . Osteoarthritis   . Pelvic inflammatory disease   . 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 MI (myocardial infarction)   . HTN (hypertension)   . Hyperlipidemia   . Hypothyroidism   . Anemia   . Lung nodule   . COPD (chronic obstructive pulmonary disease)   . Renal insufficiency   . DM (diabetes mellitus)   . CAD (coronary artery disease)    Past Surgical History  Procedure Laterality Date  . Back surgery    . Tubal ligation    . Mandible fracture surgery     Family History  Problem Relation Age of Onset  . Emphysema Sister   . Heart failure Sister   . Lung cancer Father   . Cancer Mother     glioblastoma   History  Substance Use Topics  . Smoking status: Former Games developermoker  . Smokeless tobacco: Not on file  . Alcohol Use: No   OB History    No data available     Review of Systems  Unable to perform ROS: Acuity of condition      Allergies  Iohexol; Ivp dye; Amoxicillin;  Ciprofloxacin; Keflet; Lactose intolerance (gi); Metronidazole; Nexium; Sulfamethoxazole-trimethoprim; Allopurinol; and Latex  Home Medications   Prior to Admission medications   Medication Sig Start Date End Date Taking? Authorizing Provider  albuterol (PROVENTIL HFA;VENTOLIN HFA) 108 (90 BASE) MCG/ACT inhaler Inhale 2 puffs into the lungs every 4 (four) hours as needed for wheezing or shortness of breath.    Yes Historical Provider, MD  aspirin 325 MG EC tablet Take 325 mg by mouth every morning.    Yes Historical Provider, MD  Cholecalciferol 50000 UNITS capsule Take 50,000 Units by mouth every 30 (thirty) days. Administered on the 28th day of each month   Yes Historical Provider, MD  diltiazem (CARDIZEM CD) 120 MG 24 hr capsule Take 120 mg by mouth daily. At 2pm   Yes Historical Provider, MD  docusate sodium (COLACE) 100 MG capsule Take 100 mg by mouth 2 (two) times daily.    Yes Historical Provider, MD  donepezil (ARICEPT) 5 MG tablet Take 5 mg by mouth at bedtime.   Yes Historical Provider, MD  ertapenem [facility](INVANZ) 1 G injection Inject 1 g into the muscle daily. 1/2 NS 1000ml @@ 11700ml/hr with lab results to determine rate   Yes Historical Provider, MD  ezetimibe (ZETIA) 10 MG tablet Take 10 mg by mouth every evening.   Yes Historical Provider, MD  hydrALAZINE (APRESOLINE) 10 MG tablet Take 10 mg by mouth  3 (three) times daily.   Yes Historical Provider, MD  ipratropium-albuterol (DUONEB) 0.5-2.5 (3) MG/3ML SOLN Take 3 mLs by nebulization every 6 (six) hours as needed (72 hour course starting on 05/09/2014).   Yes Historical Provider, MD  loratadine (CLARITIN) 10 MG tablet Take 10 mg by mouth daily. *May also be taken daily as needed for allergies   Yes Historical Provider, MD  methylPREDNISolone sodium succinate (SOLU-MEDROL) 125 mg/2 mL injection Inject 125 mg into the muscle once.   Yes Historical Provider, MD  omega-3 acid ethyl esters (LOVAZA) 1 G capsule Take 2 g by mouth daily.    Yes  Historical Provider, MD  pantoprazole (PROTONIX) 40 MG tablet Take 40 mg by mouth 2 (two) times daily.   Yes Historical Provider, MD  polyethylene glycol powder (GLYCOLAX/MIRALAX) powder Take 17 g by mouth daily.   Yes Historical Provider, MD  hydrALAZINE (APRESOLINE) 25 MG tablet Take 1 tablet (25 mg total) by mouth 3 (three) times daily. Patient not taking: Reported on 05/01/2014 05/12/13   Lewayne BuntingBrian S Crenshaw, MD   BP 91/57 mmHg  Pulse 126  Temp(Src) 101.9 F (38.8 C) (Core (Comment))  Resp 31  Ht 5\' 4"  (1.626 m)  Wt 150 lb 2.1 oz (68.1 kg)  BMI 25.76 kg/m2  SpO2 95% Physical Exam  Constitutional:  Ill-appearing, elderly  HENT:  Head: Normocephalic and atraumatic.  Mucous membranes dry  Eyes: Pupils are equal, round, and reactive to light.  Cardiovascular: Regular rhythm and normal heart sounds.   Tachycardia  Pulmonary/Chest: She is in respiratory distress. She has no wheezes.  Coarse breath sounds bilaterally, nonrebreather in place, tachypnea  Abdominal: Soft. Bowel sounds are normal. There is no tenderness. There is no rebound.  Musculoskeletal: She exhibits no edema.  Neurological:  Somnolent, but arousable, moves all 4 extremities spontaneously  Skin: Skin is warm. No rash noted.  Psychiatric: She has a normal mood and affect.  Nursing note and vitals reviewed.   ED Course  Procedures (including critical care time)  CRITICAL CARE Performed by: Ross MarcusHORTON, COURTNEY, F   Total critical care time: 45 min  Critical care time was exclusive of separately billable procedures and treating other patients.  Critical care was necessary to treat or prevent imminent or life-threatening deterioration.  Critical care was time spent personally by me on the following activities: development of treatment plan with patient and/or surrogate as well as nursing, discussions with consultants, evaluation of patient's response to treatment, examination of patient, obtaining history from  patient or surrogate, ordering and performing treatments and interventions, ordering and review of laboratory studies, ordering and review of radiographic studies, pulse oximetry and re-evaluation of patient's condition.  Labs Review Labs Reviewed  CBC WITH DIFFERENTIAL - Abnormal; Notable for the following:    WBC 3.2 (*)    RBC 3.77 (*)    Hemoglobin 11.1 (*)    HCT 34.5 (*)    All other components within normal limits  COMPREHENSIVE METABOLIC PANEL - Abnormal; Notable for the following:    Potassium 5.4 (*)    CO2 16 (*)    BUN 44 (*)    Creatinine, Ser 4.85 (*)    Total Protein 5.8 (*)    Albumin 2.3 (*)    AST 49 (*)    Total Bilirubin 0.2 (*)    GFR calc non Af Amer 8 (*)    GFR calc Af Amer 9 (*)    Anion gap 22 (*)    All other components within  normal limits  PRO B NATRIURETIC PEPTIDE - Abnormal; Notable for the following:    Pro B Natriuretic peptide (BNP) 20952.0 (*)    All other components within normal limits  BLOOD GAS, ARTERIAL - Abnormal; Notable for the following:    pH, Arterial 7.252 (*)    pO2, Arterial 126.0 (*)    Bicarbonate 15.1 (*)    Acid-base deficit 10.9 (*)    All other components within normal limits  PROTIME-INR - Abnormal; Notable for the following:    Prothrombin Time 17.4 (*)    All other components within normal limits  FIBRINOGEN - Abnormal; Notable for the following:    Fibrinogen 603 (*)    All other components within normal limits  I-STAT CG4 LACTIC ACID, ED - Abnormal; Notable for the following:    Lactic Acid, Venous 7.38 (*)    All other components within normal limits  MRSA PCR SCREENING  CULTURE, BLOOD (ROUTINE X 2)  CULTURE, BLOOD (ROUTINE X 2)  URINE CULTURE  CULTURE, EXPECTORATED SPUTUM-ASSESSMENT  TROPONIN I  APTT  URINALYSIS, ROUTINE W REFLEX MICROSCOPIC  COMPREHENSIVE METABOLIC PANEL  CORTISOL  BLOOD GAS, ARTERIAL  CBC WITH DIFFERENTIAL  I-STAT CG4 LACTIC ACID, ED    Imaging Review Dg Chest Port 1  View  04/21/2014   CLINICAL DATA:  Shortness of breath.  Hypotension.  EXAM: PORTABLE CHEST - 1 VIEW  COMPARISON:  CT chest 08/31/2013. Single view of the chest 04/07/2013.  FINDINGS: Dense right upper lobe airspace opacity is identified. The left lung is clear. Heart size is normal. No pneumothorax or pleural effusion.  IMPRESSION: Dense right upper lobe airspace opacity likely due to pneumonia. Recommend followup films to clearing.   Electronically Signed   By: Drusilla Kanner M.D.   On: 05/13/2014 18:21     EKG Interpretation   Date/Time:  Tuesday May 10 2014 17:55:20 EST Ventricular Rate:  121 PR Interval:  146 QRS Duration: 94 QT Interval:  298 QTC Calculation: 423 R Axis:   -72 Text Interpretation:  Sinus or ectopic atrial tachycardia Left anterior  fascicular block Abnormal R-wave progression, early transition Tachycardia  when compared to prior Confirmed by HORTON  MD, Toni Amend ([id number]) on  05/04/2014 6:13:56 PM      MDM   Final diagnoses:  Septic shock  Healthcare-associated pneumonia    This is a 73 year old female who presents with fever, hypoxia, and hypertension. She appears to be in septic shock likely secondary to healthcare associated pneumonia. Patient was given 2 L of fluid, aztreonam, and vancomycin. Lab work notable for lactic acidosis of 7.38, AKA with a creatinine 4.8, elevated BNP, and a right lower lobe opacity concerning for pneumonia. Patient was responsive to fluid.  Discuss with Dr. Konrad Dolores who will admit for septic shock and hospital-acquired pneumonia.    Shon Baton, MD June 09, 2014 [phone]

## 2014-05-10 NOTE — H&P (Signed)
[facility] History and Physical  [name] [facility] WUJ:[id number] DOB: 19-Jan-1941 DOA: 12-May-2014  Referring physician: Dr Wilkie Aye PCP: Warrick Parisian, MD   Chief Complaint: Fevers and SOB  HPI: [name] is a 73 y.o. female  Pt resident at nursing home facility. Developed chills and fatigue. Did not eat last night. devweloped fever, emesis that night. Increased confusion today. started on IVF and ABX at [facility]. SOB developed over the course of the day. Denies recent cold symptoms, dysuria, abd pain, back pain, chest pain, syncope.    Review of Systems:  Constitutional:  Per HPI w/ all other systems negative Pt was in her normal state of health prior to illness  Past Medical History  Diagnosis Date  . Osteoarthritis   . Pelvic inflammatory disease   . 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 MI (myocardial infarction)   . HTN (hypertension)   . Hyperlipidemia   . Hypothyroidism   . Anemia   . Lung nodule   . COPD (chronic obstructive pulmonary disease)   . Renal insufficiency   . DM (diabetes mellitus)   . CAD (coronary artery disease)    Past Surgical History  Procedure Laterality Date  . Back surgery    . Tubal ligation    . Mandible fracture surgery     Social History:  reports that she has quit smoking. She does not have any smokeless tobacco history on file. She reports that she does not drink alcohol. Her drug history is not on file.  Allergies  Allergen Reactions  . Iohexol Hives and Other (See Comments)     Code: HIVES, Desc: ALLERGY PER PRIOR REPORT.  PT DOES NOT REMEMBER REACTION--SHE REMEMBERS "CODE BLUE"//A.C., Onset Date: [id number]   . Ivp Dye [Iodinated Diagnostic Agents] Other (See Comments)    "shut my kidneys down"  . Amoxicillin Other (See Comments)    Pt doesn't remember reaction  . Ciprofloxacin Itching  . Keflet [Cephalexin] Other (See Comments)    Unknown reaction  . Lactose Intolerance (Gi) Other (See  Comments)    Gi upset  . Metronidazole Other (See Comments)    Pt doesn't remember reaction  . Nexium [Esomeprazole] Other (See Comments)    Advised not to take by nephrologist  . Sulfamethoxazole-Trimethoprim Other (See Comments)    Pt doesn't remember reaction  . Allopurinol Hives, Swelling and Rash    Leg swelling  . Latex Itching and Rash    Family History  Problem Relation Age of Onset  . Emphysema Sister   . Heart failure Sister   . Lung cancer Father   . Cancer Mother     glioblastoma     Prior to Admission medications   Medication Sig Start Date End Date Taking? Authorizing Provider  albuterol (PROVENTIL HFA;VENTOLIN HFA) 108 (90 BASE) MCG/ACT inhaler Inhale 2 puffs into the lungs every 4 (four) hours as needed for wheezing or shortness of breath.    Yes Historical Provider, MD  aspirin 325 MG EC tablet Take 325 mg by mouth every morning.    Yes Historical Provider, MD  Cholecalciferol 50000 UNITS capsule Take 50,000 Units by mouth every 30 (thirty) days. Administered on the 28th day of each month   Yes Historical Provider, MD  diltiazem (CARDIZEM CD) 120 MG 24 hr capsule Take 120 mg by mouth daily. At 2pm   Yes Historical Provider, MD  docusate sodium (COLACE) 100 MG capsule Take 100 mg by mouth 2 (two) times daily.  Yes Historical Provider, MD  donepezil (ARICEPT) 5 MG tablet Take 5 mg by mouth at bedtime.   Yes Historical Provider, MD  ertapenem [facility](INVANZ) 1 G injection Inject 1 g into the muscle daily. 1/2 NS 1000ml @@ 16500ml/hr with lab results to determine rate   Yes Historical Provider, MD  ezetimibe (ZETIA) 10 MG tablet Take 10 mg by mouth every evening.   Yes Historical Provider, MD  hydrALAZINE (APRESOLINE) 10 MG tablet Take 10 mg by mouth 3 (three) times daily.   Yes Historical Provider, MD  ipratropium-albuterol (DUONEB) 0.5-2.5 (3) MG/3ML SOLN Take 3 mLs by nebulization every 6 (six) hours as needed (72 hour course starting on Feb 09, 2014).   Yes Historical Provider,  MD  loratadine (CLARITIN) 10 MG tablet Take 10 mg by mouth daily. *May also be taken daily as needed for allergies   Yes Historical Provider, MD  methylPREDNISolone sodium succinate (SOLU-MEDROL) 125 mg/2 mL injection Inject 125 mg into the muscle once.   Yes Historical Provider, MD  omega-3 acid ethyl esters (LOVAZA) 1 G capsule Take 2 g by mouth daily.    Yes Historical Provider, MD  pantoprazole (PROTONIX) 40 MG tablet Take 40 mg by mouth 2 (two) times daily.   Yes Historical Provider, MD  polyethylene glycol powder (GLYCOLAX/MIRALAX) powder Take 17 g by mouth daily.   Yes Historical Provider, MD  hydrALAZINE (APRESOLINE) 25 MG tablet Take 1 tablet (25 mg total) by mouth 3 (three) times daily. Patient not taking: Reported on 11-16-2013 05/12/13   Lewayne BuntingBrian S Crenshaw, MD   Physical Exam: Filed Vitals:   Feb 09, 2014 1801 Feb 09, 2014 1822 Feb 09, 2014 1900 Feb 09, 2014 1951  BP: 74/43  94/53   Pulse: 121  124   Temp:    102.3 F (39.1 C)  TempSrc:    Core (Comment)  Resp: 32  39   Height:  5' 1.81" (1.57 m)    Weight:  66.679 kg (147 lb)    SpO2: 100%  99%     Wt Readings from Last 3 Encounters:  Feb 09, 2014 66.679 kg (147 lb)  10/21/13 66.951 kg (147 lb 9.6 oz)  04/06/13 80.196 kg (176 lb 12.8 oz)    General: In clear distress w/ constant movement in bed.  Eyes:  PERRL, normal lids, irises & conjunctiva ENT: Dry mucus membranes  Neck:  no LAD, masses or thyromegaly Cardiovascular: III/VI systolic murmur,  RRR, Tachy, trace LE edema. Telemetry: Tachy,  SR, no arrhythmias  Respiratory: Marked increased WOB. Non rebreather in place. Course breath sounds throughout w/ RUL worse than other areas in posterior fields. Good air movement   Abdomen: soft, ntnd Skin:  no rash or induration seen on limited exam Musculoskeletal:  grossly normal tone BUE/BLE Psychiatric:  grossly normal mood and affect, speech fluent and appropriate Neurologic:  grossly non-focal.          Labs on Admission:  Basic  Metabolic Panel:  Recent Labs Lab Feb 09, 2014 1835  NA 137  K 5.4*  CL 99  CO2 16*  GLUCOSE 92  BUN 44*  CREATININE 4.85*  CALCIUM 8.5   Liver Function Tests:  Recent Labs Lab Feb 09, 2014 1835  AST 49*  ALT 25  ALKPHOS 47  BILITOT 0.2*  PROT 5.8*  ALBUMIN 2.3*   No results for input(s): LIPASE, AMYLASE in the last 168 hours. No results for input(s): AMMONIA in the last 168 hours. CBC:  Recent Labs Lab Feb 09, 2014 1835  WBC 3.2*  NEUTROABS 2.1  HGB 11.1*  HCT 34.5*  MCV  91.5  PLT 203   Cardiac Enzymes: No results for input(s): CKTOTAL, CKMB, CKMBINDEX, TROPONINI in the last 168 hours.  BNP (last 3 results)  Recent Labs  04/22/2014 1835  PROBNP 20952.0*   CBG: No results for input(s): GLUCAP in the last 168 hours.  Radiological Exams on Admission: Dg Chest Port 1 View  05/16/2014   CLINICAL DATA:  Shortness of breath.  Hypotension.  EXAM: PORTABLE CHEST - 1 VIEW  COMPARISON:  CT chest 08/31/2013. Single view of the chest 04/07/2013.  FINDINGS: Dense right upper lobe airspace opacity is identified. The left lung is clear. Heart size is normal. No pneumothorax or pleural effusion.  IMPRESSION: Dense right upper lobe airspace opacity likely due to pneumonia. Recommend followup films to clearing.   Electronically Signed   By: Drusilla Kannerhomas  Dalessio M.D.   On: 04/27/2014 18:21    EKG: Independently reviewed. Sinus Tach. No change from previous. No sign of ischemia  Assessment/Plan Principal Problem:   Septic shock Active Problems:   Essential hypertension   DIASTOLIC HEART FAILURE, ACUTE   COPD    Lactic acidosis   Acute on chronic respiratory failure   Dementia   GERD (gastroesophageal reflux disease)   HCAP (healthcare-associated pneumonia)   Acute renal failure superimposed on stage 4 chronic kidney disease  73 yo f w/ h/o HTN, dCHF, COPD, MI, CKD, Dementia, GERD, and CAD presenting in acute respiratory failure and sepsis secondary to HCAP w/ DNR//DNI orders.    Sepsis: likely secondary to HCAP. BP on presentation 74/43 HR 125, Fever to 103.2, WBC 3.2, Lacitc acid 7.38. 2L NS w/ improvement. Started on Vanc in ED at 19:10. CXR showing RUL pneumonia. UA clear. [facility]??? [facility] - Admit to ICU - f/u BCX, UCX - repeat Lactic acid - Troponin - Vanc and Aztreonam - NS 7250ml/hr (2L in ED and pt w/ acute CHF and DNI)  Acute Respiratory failure: ABG 7.25, CO2 35.5, O2 126, Bicarb 15. H/O COPD but no home O2, possible superimposed flare. PT IS A DNI. ???[facility] continue w/ necessary respiratory measures including non-rebreather, CPAP/BIPAP as necessary.  - ABG in the am - continue home duonebs Q4  Acute on Chronic Diastolic CHF: Echo 2011 EF 55%. H/o non-ST elevation MI in 2010. BNP 20952. Elevation likely multifactorial due to mild acute CHF, R heart strain from above noted conditions and from acute renal failure. No home diuresis despite last cardiology note from 10/14 stating pt to use lasix PRN - monitor for fluid overload given fluid boluses for sepsis - start diuretic therapy as needed - Echo once sepsis and respiratory status improves.   Hypotension: likely secondary from sepsis and receiving BP medications this am - hold home Cardizem and hydralazine  Acute on chronic kidney disease IV-V: Cr 4.85, baseline 3. Likely secondary to dehydration and sepsis.  - IVF as above - BMET in am - nephrology consult if not improving   Hyperkalemia: 5.4 on admission.  - IVF and monitor for now.   Dementia: condition waxes and wanes. Pt AAO x3 tonight in ED. Answers most questions appropriately. Endorsed Code status as DNR/DNI.  - continue aricept  GERD:  - continue home PPI  HLD:  - continue eZetia  Code Status: DNR/DNI DVT Prophylaxis: Hep Family Communication: Daughter present for admission  Disposition Plan: pending improvement   Time spent: >70 min in critical care and coordination of pts medical conditions.  ICU admission.  MERRELL, DAVID J Family Medicine  [facility] [email or web address] Password [email or web address]

## 2014-05-10 NOTE — Progress Notes (Signed)
When RT arrived patient was on 100% NRB 02 saturations were 100% but patient looked very uncomfortable. Breathing TX given with little to no relief. Patient has coarse crackles on ascultation, placed patient on BIPAP 10/5 50%. Explained to patient and daughter that this would make it more comfortable and help her breathing. RN giving pain medicine. RT will continue to monitor

## 2014-05-10 NOTE — Progress Notes (Addendum)
ANTIBIOTIC CONSULT NOTE  Pharmacy Consult for Vancomycin & Cefepime Indication: pneumonia, sepsis  Allergies  Allergen Reactions  . Iohexol Hives and Other (See Comments)     Code: HIVES, Desc: ALLERGY PER PRIOR REPORT.  PT DOES NOT REMEMBER REACTION--SHE REMEMBERS "CODE BLUE"//A.C., Onset Date: [id number]   . Ivp Dye [Iodinated Diagnostic Agents] Other (See Comments)    "shut my kidneys down"  . Amoxicillin Other (See Comments)    Pt doesn't remember reaction  . Ciprofloxacin Itching  . Keflet [Cephalexin] Other (See Comments)    Unknown reaction  . Lactose Intolerance (Gi) Other (See Comments)    Gi upset  . Metronidazole Other (See Comments)    Pt doesn't remember reaction  . Nexium [Esomeprazole] Other (See Comments)    Advised not to take by nephrologist  . Sulfamethoxazole-Trimethoprim Other (See Comments)    Pt doesn't remember reaction  . Allopurinol Hives, Swelling and Rash    Leg swelling  . Latex Itching and Rash   Patient Measurements: Height: 5' 1.81" (157 cm) Weight: 147 lb (66.679 kg) IBW/kg (Calculated) : 49.67  Vital Signs: Temp: 103.2 F (39.6 C) (11/24 1801) Temp Source: Rectal (11/24 1801) BP: 94/53 mmHg (11/24 1900) Pulse Rate: 124 (11/24 1900) Intake/Output from previous day:   Intake/Output from this shift:    Labs:  Recent Labs  04/22/2014 1835  WBC 3.2*  HGB 11.1*  PLT 203  CREATININE 4.85*   Estimated Creatinine Clearance: 8.9 mL/min (by C-G formula based on Cr of 4.85). No results for input(s): VANCOTROUGH, VANCOPEAK, VANCORANDOM, GENTTROUGH, GENTPEAK, GENTRANDOM, TOBRATROUGH, TOBRAPEAK, TOBRARND, AMIKACINPEAK, AMIKACINTROU, AMIKACIN in the last 72 hours.   Microbiology: No results found for this or any previous visit (from the past 720 hour(s)).  Anti-infectives    Start     Dose/Rate Route Frequency Ordered Stop   05/16/2014 1845  ceFEPIme (MAXIPIME) 1 g in dextrose 5 % 50 mL IVPB     1 g100 mL/hr over 30 Minutes Intravenous  Once  04/23/2014 1843     05/01/2014 1845  vancomycin (VANCOCIN) IVPB 1000 mg/200 mL premix     1,000 mg200 mL/hr over 60 Minutes Intravenous  Once 04/20/2014 1843        Assessment: [name] with fever & altered mental status.   She was hypoxic, tachycardic, hypotensive with fever 103.2 F on admission.  Lactic acid level elevated.  CXR + PNA.   Acute on chronic renal failure noted (baseline Scr ~ 2.1-2.2). Multiple drug allergies noted including keflex.  Cefepime dose has not been administered yet.  Discussed with MD & orders changed to Aztreonam.   Vancomycin 11/24>>  Goal of Therapy:  Vancomycin trough level 15-20 mcg/ml  Plan:  Aztreonam 1gm IV x1 now then 500mg  IV q8h Vancomycin 1gm IV x1 now F/U renal function & cx data Check random Vancomycin level on 11/26 & re-dose Vancomycin when random level <6720mcg/ml  [name], [name] 04/27/2014,7:41 PM

## 2014-05-10 NOTE — Plan of Care (Signed)
Problem: Consults Goal: Pneumonia Patient Education See Patient Educatio Module for education specifics. Outcome: Progressing Goal: Skin Care Protocol Initiated - if Braden Score 18 or less If consults are not indicated, leave blank or document N/A Outcome: Completed/Met Date Met:  04/28/2014 Goal: Nutrition Consult-if indicated Outcome: Not Applicable Date Met:  70/34/03 Goal: Diabetes Guidelines if Diabetic/Glucose > 140 If diabetic or lab glucose is > 140 mg/dl - Initiate Diabetes/Hyperglycemia Guidelines & Document Interventions  Outcome: Completed/Met Date Met:  05/09/2014  Problem: Phase I Progression Outcomes Goal: Dyspnea controlled at rest Outcome: Completed/Met Date Met:  05/05/2014 Goal: Pain controlled with appropriate interventions Outcome: Completed/Met Date Met:  05/01/2014 Goal: OOB as tolerated unless otherwise ordered Outcome: Completed/Met Date Met:  05/07/2014 Goal: First antibiotic given within 6hrs of admit Outcome: Completed/Met Date Met:  04/17/2014 Goal: Confirm chest x-ray completed Outcome: Completed/Met Date Met:  05/06/2014 Goal: Consider Infectious Disease Consult Outcome: Progressing Goal: Consider pulmonary consult Outcome: Progressing Goal: Code status addressed with pt/family Outcome: Completed/Met Date Met:  05/09/2014 Goal: Initial discharge plan identified Outcome: Completed/Met Date Met:  05/08/2014 Goal: Voiding-avoid urinary catheter unless indicated Outcome: Progressing Goal: Hemodynamically stable Outcome: Progressing

## 2014-05-10 NOTE — Progress Notes (Signed)
ICD-9-CM ICD-10-CM   1. Septic shock 038.9 A41.9    785.52 R65.21    995.92    2. Healthcare-associated pneumonia 486 J18.9     Camera exam  - looks uncomfortable   PULMONARY  Recent Labs Lab 05/03/2014 2018  PHART 7.252*  PCO2ART 35.5  PO2ART 126.0*  HCO3 15.1*  TCO2 14.4  O2SAT 98.1    CBC  Recent Labs Lab 05/09/2014 1835  HGB 11.1*  HCT 34.5*  WBC 3.2*  PLT 203    COAGULATION  Recent Labs Lab 05/05/2014 2049  INR 1.41    CARDIAC   Recent Labs Lab 04/18/2014 2049  TROPONINI <0.30    Recent Labs Lab 05/09/2014 1835  PROBNP 20952.0*     CHEMISTRY  Recent Labs Lab 04/19/2014 1835  NA 137  K 5.4*  CL 99  CO2 16*  GLUCOSE 92  BUN 44*  CREATININE 4.85*  CALCIUM 8.5   Estimated Creatinine Clearance: 9.5 mL/min (by C-G formula based on Cr of 4.85).   LIVER  Recent Labs Lab 04/29/2014 1835 04/22/2014 2049  AST 49*  --   ALT 25  --   ALKPHOS 47  --   BILITOT 0.2*  --   PROT 5.8*  --   ALBUMIN 2.3*  --   INR  --  1.41     INFECTIOUS  Recent Labs Lab 04/24/2014 1839  LATICACIDVEN 7.38*     ENDOCRINE CBG (last 3)  No results for input(s): GLUCAP in the last 72 hours.       IMAGING x48h Dg Chest Port 1 View  05/03/2014   CLINICAL DATA:  Shortness of breath.  Hypotension.  EXAM: PORTABLE CHEST - 1 VIEW  COMPARISON:  CT chest 08/31/2013. Single view of the chest 04/07/2013.  FINDINGS: Dense right upper lobe airspace opacity is identified. The left lung is clear. Heart size is normal. No pneumothorax or pleural effusion.  IMPRESSION: Dense right upper lobe airspace opacity likely due to pneumonia. Recommend followup films to clearing.   Electronically Signed   By: Drusilla Kannerhomas  Dalessio M.D.   On: 05/16/2014 18:21    She has multi organ dysfhn     Code Status Orders        Start     Ordered   05/02/2014 2053  Do not attempt resuscitation (DNR)   Continuous    Question Answer Comment  In the event of cardiac or respiratory ARREST  Do not call a "code blue"   In the event of cardiac or respiratory ARREST Do not perform Intubation, CPR, defibrillation or ACLS   In the event of cardiac or respiratory ARREST Use medication by any route, position, wound care, and other measures to relive pain and suffering. May use oxygen, suction and manual treatment of airway obstruction as needed for comfort.      05/15/2014 2117    Advance Directive Documentation        Most Recent Value   Type of Advance Directive  Out of facility DNR (pink MOST or yellow form)   Pre-existing out of facility DNR order (yellow form or pink MOST form)  Physician notified to receive inpatient order   "MOST" Form in Place?          PLAN  no acute eICU intervention Needs better comfort - will alert RN    Dr. Kalman ShanMurali Hung Rhinesmith, M.D., [facility] Pulmonary and Critical Care Medicine Staff Physician [facility] Pager: [phone], If no answer or  between  15:00h - 7:00h: call [phone]  05/03/2014 10:53 PM

## 2014-05-10 NOTE — ED Notes (Signed)
In by EMS with respiratory distress and hypotension.  Per daughter she started running fever a last night.

## 2014-05-11 LAB — CORTISOL: CORTISOL PLASMA: 106.8 ug/dL

## 2014-05-11 MED ORDER — MORPHINE SULFATE 2 MG/ML IJ SOLN: 2.0000 mg | Freq: Once | INTRAMUSCULAR | Status: DC

## 2014-05-11 MED ORDER — NOREPINEPHRINE BITARTRATE 1 MG/ML IV SOLN
INTRAVENOUS | Status: AC
  Filled 2014-05-11: qty 4

## 2014-05-11 MED ORDER — NOREPINEPHRINE BITARTRATE 1 MG/ML IV SOLN
0.0000 ug/min | INTRAVENOUS | Status: DC
  Filled 2014-05-11: qty 16

## 2014-05-11 MED ORDER — SODIUM CHLORIDE 0.9 % IV BOLUS (SEPSIS)
500.0000 mL | INTRAVENOUS | Status: AC
  Administered 2014-05-11: 500 mL via INTRAVENOUS

## 2014-05-15 LAB — CULTURE, BLOOD (ROUTINE X 2)
CULTURE: NO GROWTH
Culture: NO GROWTH

## 2014-05-17 NOTE — Discharge Summary (Signed)
[facility] Death Summary  [name] [id number] DOB: 03-30-1941 DOA: 11-15-2013  Holly LeaverBarbary Weeks was a 73 year old female admitted on 2014-03-17 at 20:00 for Sepsis and Acute respiratory failure. Pt was noted to be DNR/DNI. This was confirmed with the patient at time of admission along with the pts daughter. Appropriate therapy was started but the pts condition continued to deteriorate.  At 04:15 on 05/16/2014 the pt expired secondary to cardiac arrest secondary to respiratory failure and sepsis from a health care acquired pneumonia.     Past Medical History  Diagnosis Date  . Osteoarthritis   . Pelvic inflammatory disease   . 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 MI (myocardial infarction)   . HTN (hypertension)   . Hyperlipidemia   . Hypothyroidism   . Anemia   . Lung nodule   . COPD (chronic obstructive pulmonary disease)   . Renal insufficiency   . DM (diabetes mellitus)   . CAD (coronary artery disease)    Past Surgical History  Procedure Laterality Date  . Back surgery    . Tubal ligation    . Mandible fracture surgery     Social History:  reports that she has quit smoking. She does not have any smokeless tobacco history on file. She reports that she does not drink alcohol. Her drug history is not on file.  Allergies  Allergen Reactions  . Iohexol Hives and Other (See Comments)     Code: HIVES, Desc: ALLERGY PER PRIOR REPORT.  PT DOES NOT REMEMBER REACTION--SHE REMEMBERS "CODE BLUE"//A.C., Onset Date: [id number]   . Ivp Dye [Iodinated Diagnostic Agents] Other (See Comments)    "shut my kidneys down"  . Amoxicillin Other (See Comments)    Pt doesn't remember reaction  . Ciprofloxacin Itching  . Keflet [Cephalexin] Other (See Comments)    Unknown reaction  . Lactose Intolerance (Gi) Other (See Comments)    Gi upset  . Metronidazole Other (See Comments)    Pt doesn't remember reaction  . Nexium [Esomeprazole] Other (See  Comments)    Advised not to take by nephrologist  . Sulfamethoxazole-Trimethoprim Other (See Comments)    Pt doesn't remember reaction  . Allopurinol Hives, Swelling and Rash    Leg swelling  . Latex Itching and Rash    Family History  Problem Relation Age of Onset  . Emphysema Sister   . Heart failure Sister   . Lung cancer Father   . Cancer Mother     glioblastoma     Prior to Admission medications   Medication Sig Start Date End Date Taking? Authorizing Provider  albuterol (PROVENTIL HFA;VENTOLIN HFA) 108 (90 BASE) MCG/ACT inhaler Inhale 2 puffs into the lungs every 4 (four) hours as needed for wheezing or shortness of breath.    Yes Historical Provider, MD  aspirin 325 MG EC tablet Take 325 mg by mouth every morning.    Yes Historical Provider, MD  Cholecalciferol 50000 UNITS capsule Take 50,000 Units by mouth every 30 (thirty) days. Administered on the 28th day of each month   Yes Historical Provider, MD  diltiazem (CARDIZEM CD) 120 MG 24 hr capsule Take 120 mg by mouth daily. At 2pm   Yes Historical Provider, MD  docusate sodium (COLACE) 100 MG capsule Take 100 mg by mouth 2 (two) times daily.    Yes Historical Provider, MD  donepezil (ARICEPT) 5 MG tablet Take 5 mg by mouth at bedtime.   Yes Historical Provider, MD  ertapenem (  INVANZ) 1 G injection Inject 1 g into the muscle daily. 1/2 NS 1000ml @@ 13400ml/hr with lab results to determine rate   Yes Historical Provider, MD  ezetimibe (ZETIA) 10 MG tablet Take 10 mg by mouth every evening.   Yes Historical Provider, MD  hydrALAZINE (APRESOLINE) 10 MG tablet Take 10 mg by mouth 3 (three) times daily.   Yes Historical Provider, MD  ipratropium-albuterol (DUONEB) 0.5-2.5 (3) MG/3ML SOLN Take 3 mLs by nebulization every 6 (six) hours as needed (72 hour course starting on 04/21/2014).   Yes Historical Provider, MD  loratadine (CLARITIN) 10 MG tablet Take 10 mg by mouth daily. *May also be taken daily as needed for allergies   Yes  Historical Provider, MD  methylPREDNISolone sodium succinate (SOLU-MEDROL) 125 mg/2 mL injection Inject 125 mg into the muscle once.   Yes Historical Provider, MD  omega-3 acid ethyl esters (LOVAZA) 1 G capsule Take 2 g by mouth daily.    Yes Historical Provider, MD  pantoprazole (PROTONIX) 40 MG tablet Take 40 mg by mouth 2 (two) times daily.   Yes Historical Provider, MD  polyethylene glycol powder (GLYCOLAX/MIRALAX) powder Take 17 g by mouth daily.   Yes Historical Provider, MD  hydrALAZINE (APRESOLINE) 25 MG tablet Take 1 tablet (25 mg total) by mouth 3 (three) times daily. Patient not taking: Reported on 05/15/2014 05/12/13   Lewayne BuntingBrian S Crenshaw, MD   Physical Exam: Filed Vitals:   Jan 15, 2014 0100 Jan 15, 2014 0200 Jan 15, 2014 0300 Jan 15, 2014 0400  BP: 87/47 73/52 76/43  63/42  Pulse: 103 96 102 95  Temp:    102.1 F (38.9 C)  TempSrc:    Core (Comment)  Resp: 38 36 28 36  Height:      Weight:    68.3 kg (150 lb 9.2 oz)  SpO2: 97% 94% 83% 90%    Wt Readings from Last 3 Encounters:  Jan 15, 2014 68.3 kg (150 lb 9.2 oz)  10/21/13 66.951 kg (147 lb 9.6 oz)  04/06/13 80.196 kg (176 lb 12.8 oz)   Problems addressed during admission.  Principal Problem:   Septic shock Active Problems:   Essential hypertension   DIASTOLIC HEART FAILURE, ACUTE   COPD    Lactic acidosis   Acute on chronic respiratory failure   Dementia   GERD (gastroesophageal reflux disease)   HCAP (healthcare-associated pneumonia)   Acute renal failure superimposed on stage 4 chronic kidney disease    [name] [email or web address] Password [email or web address]

## 2014-05-17 NOTE — Progress Notes (Signed)
Patient color more ashen respiratory effort decreased. HR up to 130 then down into 70s and sustained. After another increase in rate to 120s HR dropped into 60s and sustained. Slowly brady down til asystole. Family at bedside. RNs verifiy via auscultation. Time of passing pronounced as 0415. Attending MD made aware.

## 2014-05-17 NOTE — Progress Notes (Signed)
Patients body was picked up by security and funeral home. Body was identified and verified of [name]. No difficulty moving and body was placed face up on stretcher.

## 2014-05-17 NOTE — Care Management Utilization Note (Signed)
UR review complete.  

## 2014-05-17 DEATH — deceased
